# Patient Record
Sex: Female | Born: 1970 | Race: White | Hispanic: No | Marital: Married | Smoking: Current every day smoker
Health system: Southern US, Community
[De-identification: ages and names within clinical notes are randomized; demographics above are authoritative.]

## PROBLEM LIST (undated history)

## (undated) DIAGNOSIS — E119 Type 2 diabetes mellitus without complications: Secondary | ICD-10-CM

## (undated) DIAGNOSIS — I341 Nonrheumatic mitral (valve) prolapse: Secondary | ICD-10-CM

## (undated) DIAGNOSIS — E669 Obesity, unspecified: Secondary | ICD-10-CM

## (undated) DIAGNOSIS — I10 Essential (primary) hypertension: Secondary | ICD-10-CM

## (undated) DIAGNOSIS — I471 Supraventricular tachycardia, unspecified: Secondary | ICD-10-CM

## (undated) HISTORY — PX: NASAL SEPTUM SURGERY: SHX37

## (undated) HISTORY — PX: LUMBAR FUSION: SHX111

## (undated) HISTORY — PX: TONSILLECTOMY: SUR1361

## (undated) HISTORY — DX: Type 2 diabetes mellitus without complications: E11.9

---
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
## 1999-11-05 HISTORY — PX: TUBAL LIGATION: SHX77

## 2015-02-17 ENCOUNTER — Other Ambulatory Visit (HOSPITAL_COMMUNITY): Payer: Self-pay | Admitting: Physician Assistant

## 2015-02-17 ENCOUNTER — Other Ambulatory Visit (HOSPITAL_COMMUNITY): Payer: Self-pay | Admitting: Physical Medicine and Rehabilitation

## 2015-02-17 DIAGNOSIS — Z981 Arthrodesis status: Secondary | ICD-10-CM

## 2015-02-23 ENCOUNTER — Ambulatory Visit (HOSPITAL_COMMUNITY): Payer: Self-pay

## 2015-02-24 ENCOUNTER — Emergency Department (HOSPITAL_COMMUNITY): Payer: No Typology Code available for payment source

## 2015-02-24 ENCOUNTER — Encounter (HOSPITAL_COMMUNITY): Payer: Self-pay | Admitting: Emergency Medicine

## 2015-02-24 ENCOUNTER — Emergency Department (HOSPITAL_COMMUNITY)
Admission: EM | Admit: 2015-02-24 | Discharge: 2015-02-24 | Disposition: A | Discharge status: Home / Self Care | Payer: No Typology Code available for payment source | Attending: Emergency Medicine | Admitting: Emergency Medicine

## 2015-02-24 DIAGNOSIS — Y92410 Unspecified street and highway as the place of occurrence of the external cause: Secondary | ICD-10-CM | POA: Diagnosis not present

## 2015-02-24 DIAGNOSIS — S161XXA Strain of muscle, fascia and tendon at neck level, initial encounter: Secondary | ICD-10-CM | POA: Diagnosis not present

## 2015-02-24 DIAGNOSIS — Z9889 Other specified postprocedural states: Secondary | ICD-10-CM | POA: Diagnosis not present

## 2015-02-24 DIAGNOSIS — I10 Essential (primary) hypertension: Secondary | ICD-10-CM | POA: Insufficient documentation

## 2015-02-24 DIAGNOSIS — S79911A Unspecified injury of right hip, initial encounter: Secondary | ICD-10-CM | POA: Insufficient documentation

## 2015-02-24 DIAGNOSIS — S3992XA Unspecified injury of lower back, initial encounter: Secondary | ICD-10-CM | POA: Insufficient documentation

## 2015-02-24 DIAGNOSIS — M5441 Lumbago with sciatica, right side: Secondary | ICD-10-CM

## 2015-02-24 DIAGNOSIS — Z720 Tobacco use: Secondary | ICD-10-CM | POA: Diagnosis not present

## 2015-02-24 DIAGNOSIS — M5442 Lumbago with sciatica, left side: Secondary | ICD-10-CM

## 2015-02-24 DIAGNOSIS — R200 Anesthesia of skin: Secondary | ICD-10-CM | POA: Diagnosis not present

## 2015-02-24 DIAGNOSIS — Y9389 Activity, other specified: Secondary | ICD-10-CM | POA: Diagnosis not present

## 2015-02-24 DIAGNOSIS — Y998 Other external cause status: Secondary | ICD-10-CM | POA: Diagnosis not present

## 2015-02-24 HISTORY — DX: Essential (primary) hypertension: I10

## 2015-02-24 MED ORDER — OXYCODONE-ACETAMINOPHEN 5-325 MG PO TABS
1.0000 | ORAL_TABLET | Freq: Once | ORAL | Status: AC
  Administered 2015-02-24: 1 via ORAL
  Filled 2015-02-24: qty 1

## 2015-02-24 MED ORDER — HYDROMORPHONE HCL 1 MG/ML IJ SOLN
1.0000 mg | Freq: Once | INTRAMUSCULAR | Status: AC
  Administered 2015-02-24: 1 mg via INTRAMUSCULAR
  Filled 2015-02-24: qty 1

## 2015-02-24 MED ORDER — OXYCODONE-ACETAMINOPHEN 5-325 MG PO TABS: 1.0000 | ORAL_TABLET | ORAL | Status: DC | PRN

## 2015-02-24 MED ORDER — ONDANSETRON 8 MG PO TBDP
8.0000 mg | ORAL_TABLET | Freq: Once | ORAL | Status: AC
  Administered 2015-02-24: 8 mg via ORAL
  Filled 2015-02-24: qty 1

## 2015-02-24 MED ORDER — METHOCARBAMOL 500 MG PO TABS: 500.0000 mg | ORAL_TABLET | Freq: Three times a day (TID) | ORAL | Status: DC
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
## 2015-09-27 ENCOUNTER — Emergency Department
Admission: EM | Admit: 2015-09-27 | Discharge: 2015-09-27 | Disposition: A | Discharge status: Home / Self Care | Payer: Medicaid Other | Attending: Emergency Medicine | Admitting: Emergency Medicine

## 2015-09-27 ENCOUNTER — Emergency Department: Payer: Medicaid Other

## 2015-09-27 ENCOUNTER — Encounter: Payer: Self-pay | Admitting: Emergency Medicine

## 2015-09-27 DIAGNOSIS — F17200 Nicotine dependence, unspecified, uncomplicated: Secondary | ICD-10-CM | POA: Diagnosis not present

## 2015-09-27 DIAGNOSIS — R51 Headache: Secondary | ICD-10-CM | POA: Insufficient documentation

## 2015-09-27 DIAGNOSIS — R519 Headache, unspecified: Secondary | ICD-10-CM

## 2015-09-27 DIAGNOSIS — I10 Essential (primary) hypertension: Secondary | ICD-10-CM | POA: Diagnosis not present

## 2015-09-27 DIAGNOSIS — H538 Other visual disturbances: Secondary | ICD-10-CM | POA: Insufficient documentation

## 2015-09-27 LAB — CBC WITH DIFFERENTIAL/PLATELET
BASOS PCT: 0 %
Basophils Absolute: 0.1 10*3/uL (ref 0–0.1)
EOS ABS: 0.3 10*3/uL (ref 0–0.7)
Eosinophils Relative: 2 %
HCT: 45.3 % (ref 35.0–47.0)
Hemoglobin: 15.4 g/dL (ref 12.0–16.0)
Lymphocytes Relative: 26 %
Lymphs Abs: 4.3 10*3/uL — ABNORMAL HIGH (ref 1.0–3.6)
MCH: 31.4 pg (ref 26.0–34.0)
MCHC: 33.9 g/dL (ref 32.0–36.0)
MCV: 92.5 fL (ref 80.0–100.0)
MONO ABS: 0.9 10*3/uL (ref 0.2–0.9)
Monocytes Relative: 6 %
Neutro Abs: 10.8 10*3/uL — ABNORMAL HIGH (ref 1.4–6.5)
Neutrophils Relative %: 66 %
PLATELETS: 298 10*3/uL (ref 150–440)
RBC: 4.9 MIL/uL (ref 3.80–5.20)
RDW: 13 % (ref 11.5–14.5)
WBC: 16.3 10*3/uL — AB (ref 3.6–11.0)

## 2015-09-27 LAB — COMPREHENSIVE METABOLIC PANEL
ALT: 26 U/L (ref 14–54)
AST: 23 U/L (ref 15–41)
Albumin: 4 g/dL (ref 3.5–5.0)
Alkaline Phosphatase: 61 U/L (ref 38–126)
Anion gap: 8 (ref 5–15)
BUN: 9 mg/dL (ref 6–20)
CHLORIDE: 103 mmol/L (ref 101–111)
CO2: 25 mmol/L (ref 22–32)
Calcium: 9.3 mg/dL (ref 8.9–10.3)
Creatinine, Ser: 0.74 mg/dL (ref 0.44–1.00)
GFR calc Af Amer: >60 mL/min (ref >60)
GFR calc non Af Amer: >60 mL/min (ref >60)
Glucose, Bld: 148 mg/dL — ABNORMAL HIGH (ref 65–99)
POTASSIUM: 3.8 mmol/L (ref 3.5–5.1)
Sodium: 136 mmol/L (ref 135–145)
Total Bilirubin: 0.2 mg/dL — ABNORMAL LOW (ref 0.3–1.2)
Total Protein: 7.5 g/dL (ref 6.5–8.1)

## 2015-09-27 MED ORDER — HYDROMORPHONE HCL 1 MG/ML IJ SOLN
0.5000 mg | Freq: Once | INTRAMUSCULAR | Status: AC
  Administered 2015-09-27: 0.5 mg via INTRAVENOUS

## 2015-09-27 MED ORDER — BUTALBITAL-APAP-CAFFEINE 50-325-40 MG PO TABS: 1.0000 | ORAL_TABLET | Freq: Four times a day (QID) | ORAL | Status: AC | PRN

## 2015-09-27 MED ORDER — HYDROMORPHONE HCL 1 MG/ML IJ SOLN
1.0000 mg | Freq: Once | INTRAMUSCULAR | Status: AC
  Administered 2015-09-27: 1 mg via INTRAVENOUS

## 2015-09-27 MED ORDER — METOCLOPRAMIDE HCL 5 MG/ML IJ SOLN
10.0000 mg | Freq: Once | INTRAMUSCULAR | Status: AC
  Administered 2015-09-27: 10 mg via INTRAVENOUS
  Filled 2015-09-27: qty 2

## 2015-09-27 MED ORDER — HYDROMORPHONE HCL 1 MG/ML IJ SOLN
INTRAMUSCULAR | Status: AC
  Filled 2015-09-27: qty 1

## 2015-09-27 MED ORDER — DIPHENHYDRAMINE HCL 50 MG/ML IJ SOLN
INTRAMUSCULAR | Status: AC
  Administered 2015-09-27: 25 mg via INTRAVENOUS
  Filled 2015-09-27: qty 1

## 2015-09-27 MED ORDER — KETOROLAC TROMETHAMINE 30 MG/ML IJ SOLN
30.0000 mg | Freq: Once | INTRAMUSCULAR | Status: AC
  Administered 2015-09-27: 30 mg via INTRAVENOUS

## 2015-09-27 MED ORDER — METOCLOPRAMIDE HCL 5 MG/ML IJ SOLN
INTRAMUSCULAR | Status: AC
  Administered 2015-09-27: 10 mg via INTRAVENOUS
  Filled 2015-09-27: qty 2

## 2015-09-27 MED ORDER — DIPHENHYDRAMINE HCL 50 MG/ML IJ SOLN
25.0000 mg | Freq: Once | INTRAMUSCULAR | Status: AC
  Administered 2015-09-27: 25 mg via INTRAVENOUS

## 2015-09-27 MED ORDER — HYDROMORPHONE HCL 1 MG/ML IJ SOLN
INTRAMUSCULAR | Status: AC
  Administered 2015-09-27: 0.5 mg via INTRAVENOUS
  Filled 2015-09-27: qty 1

## 2015-09-27 MED ORDER — SODIUM CHLORIDE 0.9 % IV SOLN
Freq: Once | INTRAVENOUS | Status: AC
  Administered 2015-09-27: 20:00:00 via INTRAVENOUS

## 2015-09-27 MED ORDER — KETOROLAC TROMETHAMINE 30 MG/ML IJ SOLN
INTRAMUSCULAR | Status: AC
  Administered 2015-09-27: 30 mg via INTRAVENOUS
  Filled 2015-09-27: qty 1

## 2015-09-27 NOTE — ED Provider Notes (Signed)
[facility] Emergency Department Provider Note     Time seen: ----------------------------------------- 7:46 PM on 09/27/2015 -----------------------------------------    I have reviewed the triage vital signs and the nursing notes.   HISTORY  Chief Complaint Headache    HPI Leslie Zamora is a 45 y.o. female who presents ER for multiple headaches today. Patient states her last to 3 weeks she's had headaches located in the left temporal area. States she's had multiple episodes of sharp left-sided headache. Patient states when the headache gets her vision was blurry in both eyes. Patient denies any other complaints. She denies any numbness tingling or weakness associated with headache.   Past Medical History  Diagnosis Date  . Hypertension     There are no active problems to display for this patient.   Past Surgical History  Procedure Laterality Date  . Tonsillectomy    . Nasal septum surgery    . Lumbar fusion    . Tubal ligation      Allergies Other and Gabapentin  Social History Social History  Substance Use Topics  . Smoking status: Current Every Day Smoker -- 1.00 packs/day  . Smokeless tobacco: None  . Alcohol Use: No    Review of Systems Constitutional: Negative for fever. Eyes: Negative for visual changes. ENT: Negative for sore throat. Cardiovascular: Negative for chest pain. Respiratory: Negative for shortness of breath. Gastrointestinal: Negative for abdominal pain, vomiting and diarrhea. Genitourinary: Negative for dysuria. Musculoskeletal: Negative for back pain. Skin: Negative for rash. Neurological: Positive for headache  10-point ROS otherwise negative.  ____________________________________________   PHYSICAL EXAM:  VITAL SIGNS: ED Triage Vitals  Enc Vitals Group     BP 09/27/15 1927 157/86 mmHg     Pulse Rate 09/27/15 1927 90     Resp 09/27/15 1927 18     Temp 09/27/15 1927 98 F (36.7 C)     Temp  Source 09/27/15 1927 Oral     SpO2 09/27/15 1927 97 %     Weight 09/27/15 1927 227 lb 1.2 oz (103 kg)     Height 09/27/15 1927 5\' 5"  (1.651 m)     Head Cir --      Peak Flow --      Pain Score 09/27/15 1929 8     Pain Loc --      Pain Edu? --      Excl. in [place]? --     Constitutional: Alert and oriented. Mild distress Eyes: Conjunctivae are normal. PERRL. Normal extraocular movements. ENT   Head: Normocephalic and atraumatic.   Nose: No congestion/rhinnorhea.   Mouth/Throat: Mucous membranes are moist.   Neck: No stridor. Cardiovascular: Normal rate, regular rhythm. Normal and symmetric distal pulses are present in all extremities. No murmurs, rubs, or gallops. Respiratory: Normal respiratory effort without tachypnea nor retractions. Breath sounds are clear and equal bilaterally. No wheezes/rales/rhonchi. Gastrointestinal: Soft and nontender. No distention. No abdominal bruits.  Musculoskeletal: Nontender with normal range of motion in all extremities. No joint effusions.  No lower extremity tenderness nor edema. Neurologic:  Normal speech and language. No gross focal neurologic deficits are appreciated. Speech is normal. No gait instability. Skin:  Skin is warm, dry and intact. No rash noted. Psychiatric: Mood and affect are normal. Speech and behavior are normal. Patient exhibits appropriate insight and judgment. ____________________________________________  ED COURSE:  Pertinent labs & imaging results that were available during my care of the patient were reviewed by me and considered in my medical decision making (see  chart for details). Patient is in no acute distress, will give IV headache cocktail and obtained CT imaging. ____________________________________________    LABS (pertinent positives/negatives)  Labs Reviewed  CBC WITH DIFFERENTIAL/PLATELET - Abnormal; Notable for the following:    WBC 16.3 (*)    Neutro Abs 10.8 (*)    Lymphs Abs 4.3 (*)    All  other components within normal limits  COMPREHENSIVE METABOLIC PANEL - Abnormal; Notable for the following:    Glucose, Bld 148 (*)    Total Bilirubin 0.2 (*)    All other components within normal limits    RADIOLOGY Images were viewed by me  CT head FINDINGS: The ventricles are normal in size and configuration. There is a small cavum septum pellucidum, an anatomic variant. There is no intracranial mass hemorrhage, extra-axial fluid collection, or midline shift. The gray-white compartments are normal. No acute infarct evident. The bony calvarium appears intact. The mastoid air cells are clear. Visualized orbits appear symmetric and normal bilaterally.  IMPRESSION: Study within normal limits. ____________________________________________  FINAL ASSESSMENT AND PLAN  Headache  Plan: Patient with labs and imaging as dictated above. Unclear etiology for her headaches. Patient is chronically on 10 mg Vicodin and sent home. She is stable for outpatient follow-up with her primary care doctor.   Earleen Newport, MD   Earleen Newport, MD 09/27/15 [phone]

## 2015-09-27 NOTE — ED Notes (Signed)
NAD.  VSS.

## 2015-09-27 NOTE — Discharge Instructions (Signed)

## 2015-09-27 NOTE — ED Notes (Signed)
Pt ambulatory to triage with c/o of "burst" HA, lasting approx 30 sec over the last 2-3 weeks located in left temporal and right occipital.  Pt reports diag of HTN but stopped taking meds and started excerising and that resolved it until pt hurt her back and BP has increased. Here tonight because family member worried about increasing intensity and frequency of HA.  Pt having HA in triage lasting 10-15 sec.

## 2015-10-11 ENCOUNTER — Other Ambulatory Visit: Payer: Self-pay | Admitting: Orthopedic Surgery

## 2015-10-11 DIAGNOSIS — M545 Low back pain: Secondary | ICD-10-CM

## 2015-10-20 ENCOUNTER — Other Ambulatory Visit: Payer: Self-pay

## 2015-11-29 ENCOUNTER — Encounter: Payer: Self-pay | Admitting: Emergency Medicine

## 2015-11-29 ENCOUNTER — Emergency Department
Admission: EM | Admit: 2015-11-29 | Discharge: 2015-11-29 | Disposition: A | Discharge status: Home / Self Care | Payer: Medicaid Other | Attending: Emergency Medicine | Admitting: Emergency Medicine

## 2015-11-29 ENCOUNTER — Emergency Department: Payer: Medicaid Other

## 2015-11-29 DIAGNOSIS — Z792 Long term (current) use of antibiotics: Secondary | ICD-10-CM | POA: Insufficient documentation

## 2015-11-29 DIAGNOSIS — I10 Essential (primary) hypertension: Secondary | ICD-10-CM | POA: Diagnosis not present

## 2015-11-29 DIAGNOSIS — L03211 Cellulitis of face: Secondary | ICD-10-CM | POA: Insufficient documentation

## 2015-11-29 DIAGNOSIS — Z79899 Other long term (current) drug therapy: Secondary | ICD-10-CM | POA: Diagnosis not present

## 2015-11-29 DIAGNOSIS — F17200 Nicotine dependence, unspecified, uncomplicated: Secondary | ICD-10-CM | POA: Insufficient documentation

## 2015-11-29 DIAGNOSIS — Z791 Long term (current) use of non-steroidal anti-inflammatories (NSAID): Secondary | ICD-10-CM | POA: Insufficient documentation

## 2015-11-29 DIAGNOSIS — R220 Localized swelling, mass and lump, head: Secondary | ICD-10-CM | POA: Diagnosis present

## 2015-11-29 LAB — CBC WITH DIFFERENTIAL/PLATELET
BASOS ABS: 0.1 10*3/uL (ref 0–0.1)
Basophils Relative: 1 %
EOS ABS: 0.3 10*3/uL (ref 0–0.7)
EOS PCT: 2 %
HCT: 42.7 % (ref 35.0–47.0)
Hemoglobin: 14.4 g/dL (ref 12.0–16.0)
Lymphocytes Relative: 21 %
Lymphs Abs: 3.6 10*3/uL (ref 1.0–3.6)
MCH: 30.4 pg (ref 26.0–34.0)
MCHC: 33.6 g/dL (ref 32.0–36.0)
MCV: 90.3 fL (ref 80.0–100.0)
Monocytes Absolute: 1 10*3/uL — ABNORMAL HIGH (ref 0.2–0.9)
Monocytes Relative: 6 %
Neutro Abs: 11.9 10*3/uL — ABNORMAL HIGH (ref 1.4–6.5)
Neutrophils Relative %: 70 %
PLATELETS: 298 10*3/uL (ref 150–440)
RBC: 4.73 MIL/uL (ref 3.80–5.20)
RDW: 12.8 % (ref 11.5–14.5)
WBC: 17 10*3/uL — AB (ref 3.6–11.0)

## 2015-11-29 LAB — BASIC METABOLIC PANEL
Anion gap: 12 (ref 5–15)
BUN: 11 mg/dL (ref 6–20)
CALCIUM: 9.6 mg/dL (ref 8.9–10.3)
CO2: 26 mmol/L (ref 22–32)
CREATININE: 0.78 mg/dL (ref 0.44–1.00)
Chloride: 102 mmol/L (ref 101–111)
GFR calc Af Amer: >60 mL/min (ref >60)
Glucose, Bld: 109 mg/dL — ABNORMAL HIGH (ref 65–99)
Potassium: 3.6 mmol/L (ref 3.5–5.1)
SODIUM: 140 mmol/L (ref 135–145)

## 2015-11-29 MED ORDER — CLINDAMYCIN HCL 150 MG PO CAPS: 150.0000 mg | ORAL_CAPSULE | Freq: Four times a day (QID) | ORAL | Status: DC

## 2015-11-29 MED ORDER — TRAMADOL HCL 50 MG PO TABS: 50.0000 mg | ORAL_TABLET | Freq: Four times a day (QID) | ORAL | Status: DC | PRN

## 2015-11-29 MED ORDER — SULFAMETHOXAZOLE-TRIMETHOPRIM 800-160 MG PO TABS: 1.0000 | ORAL_TABLET | Freq: Two times a day (BID) | ORAL | Status: DC

## 2015-11-29 MED ORDER — IBUPROFEN 800 MG PO TABS
800.0000 mg | ORAL_TABLET | Freq: Three times a day (TID) | ORAL | Status: DC | PRN
Start: 2015-11-29 — End: 2017-08-04

## 2015-11-29 MED ORDER — CLINDAMYCIN PHOSPHATE 600 MG/50ML IV SOLN
600.0000 mg | Freq: Once | INTRAVENOUS | Status: AC
  Administered 2015-11-29: 600 mg via INTRAVENOUS
  Filled 2015-11-29: qty 50

## 2015-11-29 MED ORDER — IOHEXOL 300 MG/ML  SOLN
75.0000 mL | Freq: Once | INTRAMUSCULAR | Status: AC | PRN
  Administered 2015-11-29: 75 mL via INTRAVENOUS
  Filled 2015-11-29: qty 75

## 2015-11-29 MED ORDER — DIPHENHYDRAMINE HCL 50 MG/ML IJ SOLN
25.0000 mg | Freq: Once | INTRAMUSCULAR | Status: AC
  Administered 2015-11-29: 25 mg via INTRAVENOUS
  Filled 2015-11-29: qty 1

## 2015-11-29 NOTE — ED Notes (Addendum)
Pt to ed with c/o right eye swelling and pain x 2 days,  Seen at urgent care and sent here.  Pt states increased pain with blinking or closing of the eye.  Pt reports blurred vision in right eye.

## 2015-11-29 NOTE — Discharge Instructions (Signed)
Take medication as directed.

## 2015-11-29 NOTE — ED Provider Notes (Signed)
[facility] Emergency Department Provider Note  ____________________________________________  Time seen: Approximately 2:18 PM  I have reviewed the triage vital signs and the nursing notes.   HISTORY  Chief Complaint Facial Swelling    HPI Leslie Zamora is a 45 y.o. female patient's report right eye edema and pain for 2 days. Patient was sent from urgent care clinic to the ED. Patient stated the pain increased with blinking or closing her eyes. Patient also reported blurry vision right eye. Patient denies any provocative incident for this complaint. No palliative measures taken for this complaint. She does not complain of pain at this time. Patient is also complaining of right sided headache. Patient denies any dental problems at this time.   Past Medical History  Diagnosis Date  . Hypertension     There are no active problems to display for this patient.   Past Surgical History  Procedure Laterality Date  . Tonsillectomy    . Nasal septum surgery    . Lumbar fusion    . Tubal ligation      Current Outpatient Rx  Name  Route  Sig  Dispense  Refill  . BIOTIN PO   Oral   Take 1 capsule by mouth daily.         . butalbital-acetaminophen-caffeine (FIORICET) 50-325-40 MG tablet   Oral   Take 1-2 tablets by mouth every 6 (six) hours as needed for headache.   20 tablet   0   . cetirizine (ZYRTEC) 10 MG tablet   Oral   Take 10 mg by mouth daily.         . clindamycin (CLEOCIN) 150 MG capsule   Oral   Take 1 capsule (150 mg total) by mouth 4 (four) times daily.   40 capsule   0   . gabapentin (NEURONTIN) 400 MG capsule   Oral   Take 800 mg by mouth 3 (three) times daily. MAY TAKE TWO ADDITIONAL IF NEEDED         . HYDROcodone-acetaminophen (NORCO/VICODIN) 5-325 MG per tablet   Oral   Take 1 tablet by mouth 2 (two) times daily as needed.         Marland Kitchen ibuprofen (ADVIL,MOTRIN) 800 MG tablet   Oral   Take 1 tablet (800 mg total) by  mouth every 8 (eight) hours as needed for moderate pain.   15 tablet   0   . meloxicam (MOBIC) 15 MG tablet   Oral   Take 15 mg by mouth at bedtime.         . methocarbamol (ROBAXIN) 500 MG tablet   Oral   Take 1 tablet (500 mg total) by mouth 3 (three) times daily.   21 tablet   0   . Multiple Vitamin (MULTIVITAMIN WITH MINERALS) TABS tablet   Oral   Take 1 tablet by mouth daily.         Marland Kitchen oxyCODONE-acetaminophen (PERCOCET/ROXICET) 5-325 MG per tablet   Oral   Take 1 tablet by mouth every 4 (four) hours as needed.   12 tablet   0   . traMADol (ULTRAM) 50 MG tablet   Oral   Take 1 tablet (50 mg total) by mouth every 6 (six) hours as needed for moderate pain.   12 tablet   0     Allergies Other and Gabapentin  Family History  Problem Relation Age of Onset  . Adopted: Yes    Social History Social History  Substance Use Topics  .  Smoking status: Current Every Day Smoker -- 1.00 packs/day  . Smokeless tobacco: None  . Alcohol Use: Yes    Review of Systems Constitutional: No fever/chills Eyes: No visual changes. ENT: No sore throat. Cardiovascular: Denies chest pain. Respiratory: Denies shortness of breath. Gastrointestinal: No abdominal pain.  No nausea, no vomiting.  No diarrhea.  No constipation. Genitourinary: Negative for dysuria. Musculoskeletal: Negative for back pain. Skin: Negative for rash. Neurological: Negative for headaches, focal weakness or numbness. 10-point ROS otherwise negative.  ____________________________________________   PHYSICAL EXAM:  VITAL SIGNS: ED Triage Vitals  Enc Vitals Group     BP 11/29/15 1334 152/92 mmHg     Pulse Rate 11/29/15 1334 91     Resp 11/29/15 1334 18     Temp 11/29/15 1334 98.6 F (37 C)     Temp Source 11/29/15 1334 Oral     SpO2 11/29/15 1334 98 %     Weight 11/29/15 1334 215 lb (97.523 kg)     Height 11/29/15 1334 5\' 4"  (1.626 m)     Head Cir --      Peak Flow --      Pain Score 11/29/15  1334 0     Pain Loc --      Pain Edu? --      Excl. in [place]? --     Constitutional: Alert and oriented. Well appearing and in no acute distress. Eyes: Conjunctivae are normal. PERRL. EOMI. Head: Atraumatic. Nose: No congestion/rhinnorhea. Mouth/Throat: Mucous membranes are moist.  Oropharynx non-erythematous. Neck: No stridor. No cervical spine tenderness to palpation. Hematological/Lymphatic/Immunilogical: No cervical lymphadenopathy. Cardiovascular: Normal rate, regular rhythm. Grossly normal heart sounds.  Good peripheral circulation. Respiratory: Normal respiratory effort.  No retractions. Lungs CTAB. Gastrointestinal: Soft and nontender. No distention. No abdominal bruits. No CVA tenderness. Musculoskeletal: No lower extremity tenderness nor edema.  No joint effusions. Neurologic:  Normal speech and language. No gross focal neurologic deficits are appreciated. No gait instability. Skin:  Skin is warm, dry and intact. No rash noted. Mild erythema and edema right maxillary area to the inferior right orbital area Psychiatric: Mood and affect are normal. Speech and behavior are normal.  ____________________________________________   LABS (all labs ordered are listed, but only abnormal results are displayed)  Labs Reviewed  BASIC METABOLIC PANEL - Abnormal; Notable for the following:    Glucose, Bld 109 (*)    All other components within normal limits  CBC WITH DIFFERENTIAL/PLATELET - Abnormal; Notable for the following:    WBC 17.0 (*)    Neutro Abs 11.9 (*)    Monocytes Absolute 1.0 (*)    All other components within normal limits   ____________________________________________  EKG   ____________________________________________  RADIOLOGY No facial abscess. ____________________________________________   PROCEDURES  Procedure(s) performed: None  Critical Care performed: No  ____________________________________________   INITIAL IMPRESSION / ASSESSMENT AND PLAN  / ED COURSE  Pertinent labs & imaging results that were available during my care of the patient were reviewed by me and considered in my medical decision making (see chart for details).  Right maxillary cellulitis. Discuss CT findings with patient. Give 600 mg of clindamycin and will follow-up with oral medication for 10 days. ____________________________________________   FINAL CLINICAL IMPRESSION(S) / ED DIAGNOSES  Final diagnoses:  Facial cellulitis      Sable Feil, PA-C 11/29/15 1538  Nance Pear, MD 12/05/15 1400

## 2015-11-29 NOTE — ED Provider Notes (Signed)
Addendum patient began having some itching around her eyes prior to discharge. Benadryl was given to the patient and prescription was changed from clindamycin to Bactrim DS twice a day for 10 days. Patient is continue taking Benadryl as needed for itching and return to the emergency room if any severe worsening of her symptoms.  Johnn Hai, PA-C 11/29/15 1729  Nance Pear, MD 11/29/15 [id number]

## 2015-11-29 NOTE — ED Notes (Signed)
Patient transported to CT 

## 2015-12-06 ENCOUNTER — Other Ambulatory Visit: Payer: Self-pay | Admitting: Unknown Physician Specialty

## 2015-12-06 DIAGNOSIS — R1032 Left lower quadrant pain: Secondary | ICD-10-CM

## 2015-12-07 ENCOUNTER — Ambulatory Visit
Admission: RE | Admit: 2015-12-07 | Discharge: 2015-12-07 | Disposition: A | Discharge status: Home / Self Care | Payer: Medicare Other | Source: Ambulatory Visit | Attending: Unknown Physician Specialty | Admitting: Unknown Physician Specialty

## 2015-12-07 ENCOUNTER — Ambulatory Visit: Admission: RE | Admit: 2015-12-07 | Payer: Medicare Other | Source: Ambulatory Visit

## 2015-12-07 DIAGNOSIS — R1032 Left lower quadrant pain: Secondary | ICD-10-CM | POA: Diagnosis not present

## 2016-01-18 ENCOUNTER — Emergency Department: Payer: Medicare Other

## 2016-01-18 ENCOUNTER — Encounter: Payer: Self-pay | Admitting: Emergency Medicine

## 2016-01-18 DIAGNOSIS — F17210 Nicotine dependence, cigarettes, uncomplicated: Secondary | ICD-10-CM | POA: Diagnosis not present

## 2016-01-18 DIAGNOSIS — I10 Essential (primary) hypertension: Secondary | ICD-10-CM | POA: Insufficient documentation

## 2016-01-18 DIAGNOSIS — I341 Nonrheumatic mitral (valve) prolapse: Secondary | ICD-10-CM | POA: Diagnosis not present

## 2016-01-18 DIAGNOSIS — Z791 Long term (current) use of non-steroidal anti-inflammatories (NSAID): Secondary | ICD-10-CM | POA: Diagnosis not present

## 2016-01-18 DIAGNOSIS — Z79899 Other long term (current) drug therapy: Secondary | ICD-10-CM | POA: Insufficient documentation

## 2016-01-18 DIAGNOSIS — R51 Headache: Secondary | ICD-10-CM | POA: Diagnosis present

## 2016-01-18 LAB — BASIC METABOLIC PANEL
ANION GAP: 8 (ref 5–15)
BUN: 12 mg/dL (ref 6–20)
CALCIUM: 9.2 mg/dL (ref 8.9–10.3)
CHLORIDE: 107 mmol/L (ref 101–111)
CO2: 25 mmol/L (ref 22–32)
Creatinine, Ser: 0.77 mg/dL (ref 0.44–1.00)
GFR calc Af Amer: >60 mL/min (ref >60)
GFR calc non Af Amer: >60 mL/min (ref >60)
GLUCOSE: 106 mg/dL — AB (ref 65–99)
POTASSIUM: 3.5 mmol/L (ref 3.5–5.1)
Sodium: 140 mmol/L (ref 135–145)

## 2016-01-18 LAB — CBC
HEMATOCRIT: 42 % (ref 35.0–47.0)
HEMOGLOBIN: 14.4 g/dL (ref 12.0–16.0)
MCH: 31.2 pg (ref 26.0–34.0)
MCHC: 34.3 g/dL (ref 32.0–36.0)
MCV: 90.9 fL (ref 80.0–100.0)
Platelets: 331 10*3/uL (ref 150–440)
RBC: 4.62 MIL/uL (ref 3.80–5.20)
RDW: 13 % (ref 11.5–14.5)
WBC: 15.8 10*3/uL — ABNORMAL HIGH (ref 3.6–11.0)

## 2016-01-18 LAB — TROPONIN I: Troponin I: <0.03 ng/mL (ref <0.031)

## 2016-01-18 NOTE — ED Notes (Signed)
States has been having "a blood pressure issue for a while".  Patient describes blood pressure as labile.  Also c/o SOB and headaches x 2-3 weeks.  Symptoms worse over past few days.

## 2016-01-19 ENCOUNTER — Emergency Department: Payer: Medicare Other

## 2016-01-19 ENCOUNTER — Emergency Department
Admission: EM | Admit: 2016-01-19 | Discharge: 2016-01-19 | Disposition: A | Discharge status: Home / Self Care | Payer: Medicare Other | Attending: Emergency Medicine | Admitting: Emergency Medicine

## 2016-01-19 DIAGNOSIS — I10 Essential (primary) hypertension: Secondary | ICD-10-CM

## 2016-01-19 HISTORY — DX: Nonrheumatic mitral (valve) prolapse: I34.1

## 2016-01-19 LAB — URINALYSIS COMPLETE WITH MICROSCOPIC (ARMC ONLY)
BILIRUBIN URINE: NEGATIVE
Glucose, UA: NEGATIVE mg/dL
HGB URINE DIPSTICK: NEGATIVE
KETONES UR: NEGATIVE mg/dL
NITRITE: NEGATIVE
PH: 6 (ref 5.0–8.0)
Protein, ur: NEGATIVE mg/dL
SPECIFIC GRAVITY, URINE: 1.013 (ref 1.005–1.030)

## 2016-01-19 MED ORDER — SODIUM CHLORIDE 0.9 % IV SOLN: Freq: Once | INTRAVENOUS | Status: DC

## 2016-01-19 MED ORDER — PROCHLORPERAZINE EDISYLATE 5 MG/ML IJ SOLN
10.0000 mg | Freq: Four times a day (QID) | INTRAMUSCULAR | Status: DC | PRN
  Filled 2016-01-19: qty 2

## 2016-01-19 NOTE — Discharge Instructions (Signed)
DASH Eating Plan °DASH stands for "Dietary Approaches to Stop Hypertension." The DASH eating plan is a healthy eating plan that has been shown to reduce high blood pressure (hypertension). Additional health benefits may include reducing the risk of type 2 diabetes mellitus, heart disease, and stroke. The DASH eating plan may also help with weight loss. °WHAT DO I NEED TO KNOW ABOUT THE DASH EATING PLAN? °For the DASH eating plan, you will follow these general guidelines: °· Choose foods with a percent daily value for sodium of less than 5% (as listed on the food label). °· Use salt-free seasonings or herbs instead of table salt or sea salt. °· Check with your health care provider or pharmacist before using salt substitutes. °· Eat lower-sodium products, often labeled as "lower sodium" or "no salt added." °· Eat fresh foods. °· Eat more vegetables, fruits, and low-fat dairy products. °· Choose whole grains. Look for the word "whole" as the first word in the ingredient list. °· Choose fish and skinless chicken or turkey more often than red meat. Limit fish, poultry, and meat to 6 oz (170 g) each day. °· Limit sweets, desserts, sugars, and sugary drinks. °· Choose heart-healthy fats. °· Limit cheese to 1 oz (28 g) per day. °· Eat more home-cooked food and less restaurant, buffet, and fast food. °· Limit fried foods. °· Cook foods using methods other than frying. °· Limit canned vegetables. If you do use them, rinse them well to decrease the sodium. °· When eating at a restaurant, ask that your food be prepared with less salt, or no salt if possible. °WHAT FOODS CAN I EAT? °Seek help from a dietitian for individual calorie needs. °Grains °Whole grain or whole wheat bread. Brown rice. Whole grain or whole wheat pasta. Quinoa, bulgur, and whole grain cereals. Low-sodium cereals. Corn or whole wheat flour tortillas. Whole grain cornbread. Whole grain crackers. Low-sodium crackers. °Vegetables °Fresh or frozen vegetables  (raw, steamed, roasted, or grilled). Low-sodium or reduced-sodium tomato and vegetable juices. Low-sodium or reduced-sodium tomato sauce and paste. Low-sodium or reduced-sodium canned vegetables.  °Fruits °All fresh, canned (in natural juice), or frozen fruits. °Meat and Other Protein Products °Ground beef (85% or leaner), grass-fed beef, or beef trimmed of fat. Skinless chicken or turkey. Ground chicken or turkey. Pork trimmed of fat. All fish and seafood. Eggs. Dried beans, peas, or lentils. Unsalted nuts and seeds. Unsalted canned beans. °Dairy °Low-fat dairy products, such as skim or 1% milk, 2% or reduced-fat cheeses, low-fat ricotta or cottage cheese, or plain low-fat yogurt. Low-sodium or reduced-sodium cheeses. °Fats and Oils °Tub margarines without trans fats. Light or reduced-fat mayonnaise and salad dressings (reduced sodium). Avocado. Safflower, olive, or canola oils. Natural peanut or almond butter. °Other °Unsalted popcorn and pretzels. °The items listed above may not be a complete list of recommended foods or beverages. Contact your dietitian for more options. °WHAT FOODS ARE NOT RECOMMENDED? °Grains °White bread. White pasta. White rice. Refined cornbread. Bagels and croissants. Crackers that contain trans fat. °Vegetables °Creamed or fried vegetables. Vegetables in a cheese sauce. Regular canned vegetables. Regular canned tomato sauce and paste. Regular tomato and vegetable juices. °Fruits °Dried fruits. Canned fruit in light or heavy syrup. Fruit juice. °Meat and Other Protein Products °Fatty cuts of meat. Ribs, chicken wings, bacon, sausage, bologna, salami, chitterlings, fatback, hot dogs, bratwurst, and packaged luncheon meats. Salted nuts and seeds. Canned beans with salt. °Dairy °Whole or 2% milk, cream, half-and-half, and cream cheese. Whole-fat or sweetened yogurt. Full-fat   cheeses or blue cheese. Nondairy creamers and whipped toppings. Processed cheese, cheese spreads, or cheese  curds. °Condiments °Onion and garlic salt, seasoned salt, table salt, and sea salt. Canned and packaged gravies. Worcestershire sauce. Tartar sauce. Barbecue sauce. Teriyaki sauce. Soy sauce, including reduced sodium. Steak sauce. Fish sauce. Oyster sauce. Cocktail sauce. Horseradish. Ketchup and mustard. Meat flavorings and tenderizers. Bouillon cubes. Hot sauce. Tabasco sauce. Marinades. Taco seasonings. Relishes. °Fats and Oils °Butter, stick margarine, lard, shortening, ghee, and bacon fat. Coconut, palm kernel, or palm oils. Regular salad dressings. °Other °Pickles and olives. Salted popcorn and pretzels. °The items listed above may not be a complete list of foods and beverages to avoid. Contact your dietitian for more information. °WHERE CAN I FIND MORE INFORMATION? °National Heart, Lung, [employer] Institute: www.nhlbi.nih.gov/health/health-topics/topics/dash/ °  °This information is not intended to replace advice given to you by your health care provider. Make sure you discuss any questions you have with your health care provider. °  °Document Released: 10/10/2011 Document Revised: 11/11/2014 Document Reviewed: 08/25/2013 °Elsevier Interactive Patient Education ©2016 [employer] ° °Hypertension °Hypertension, commonly called high blood pressure, is when the force of blood pumping through your arteries is too strong. Your arteries are the blood vessels that carry blood from your heart throughout your body. A blood pressure reading consists of a higher number over a lower number, such as 110/72. The higher number (systolic) is the pressure inside your arteries when your heart pumps. The lower number (diastolic) is the pressure inside your arteries when your heart relaxes. Ideally you want your blood pressure below 120/80. °Hypertension forces your heart to work harder to pump blood. Your arteries may become narrow or stiff. Having untreated or uncontrolled hypertension can cause heart attack, stroke, kidney  disease, and other problems. °RISK FACTORS °Some risk factors for high blood pressure are controllable. Others are not.  °Risk factors you cannot control include:  °· Race. You may be at higher risk if you are African American. °· Age. Risk increases with age. °· Gender. Men are at higher risk than women before age 45 years. After age 65, women are at higher risk than men. °Risk factors you can control include: °· Not getting enough exercise or physical activity. °· Being overweight. °· Getting too much fat, sugar, calories, or salt in your diet. °· Drinking too much alcohol. °SIGNS AND SYMPTOMS °Hypertension does not usually cause signs or symptoms. Extremely high blood pressure (hypertensive crisis) may cause headache, anxiety, shortness of breath, and nosebleed. °DIAGNOSIS °To check if you have hypertension, your health care provider will measure your blood pressure while you are seated, with your arm held at the level of your heart. It should be measured at least twice using the same arm. Certain conditions can cause a difference in blood pressure between your right and left arms. A blood pressure reading that is higher than normal on one occasion does not mean that you need treatment. If it is not clear whether you have high blood pressure, you may be asked to return on a different day to have your blood pressure checked again. Or, you may be asked to monitor your blood pressure at home for 1 or more weeks. °TREATMENT °Treating high blood pressure includes making lifestyle changes and possibly taking medicine. Living a healthy lifestyle can help lower high blood pressure. You may need to change some of your habits. °Lifestyle changes may include: °· Following the DASH diet. This diet is high in fruits, vegetables, and whole   grains. It is low in salt, red meat, and added sugars.  Keep your sodium intake below 2,300 mg per day.  Getting at least 30-45 minutes of aerobic exercise at least 4 times per  week.  Losing weight if necessary.  Not smoking.  Limiting alcoholic beverages.  Learning ways to reduce stress. Your health care provider may prescribe medicine if lifestyle changes are not enough to get your blood pressure under control, and if one of the following is true:  You are 70-54 years of age and your systolic blood pressure is above 140.  You are 45 years of age or older, and your systolic blood pressure is above 150.  Your diastolic blood pressure is above 90.  You have diabetes, and your systolic blood pressure is over [id number] or your diastolic blood pressure is over 90.  You have kidney disease and your blood pressure is above 140/90.  You have heart disease and your blood pressure is above 140/90. Your personal target blood pressure may vary depending on your medical conditions, your age, and other factors. HOME CARE INSTRUCTIONS  Have your blood pressure rechecked as directed by your health care provider.   Take medicines only as directed by your health care provider. Follow the directions carefully. Blood pressure medicines must be taken as prescribed. The medicine does not work as well when you skip doses. Skipping doses also puts you at risk for problems.  Do not smoke.   Monitor your blood pressure at home as directed by your health care provider. SEEK MEDICAL CARE IF:   You think you are having a reaction to medicines taken.  You have recurrent headaches or feel dizzy.  You have swelling in your ankles.  You have trouble with your vision. SEEK IMMEDIATE MEDICAL CARE IF:  You develop a severe headache or confusion.  You have unusual weakness, numbness, or feel faint.  You have severe chest or abdominal pain.  You vomit repeatedly.  You have trouble breathing. MAKE SURE YOU:   Understand these instructions.  Will watch your condition.  Will get help right away if you are not doing well or get worse.   This information is not intended to  replace advice given to you by your health care provider. Make sure you discuss any questions you have with your health care provider.   Document Released: 10/21/2005 Document Revised: 03/07/2015 Document Reviewed: 08/13/2013 Elsevier Interactive Patient Education 2016 [place] going with his diet and exercise. Try the hydrochlorothiazide one half pill a day. Make sure to follow up with your family practice doctor as planned. A daily record of your blood pressures. Please return for any further problems.

## 2016-01-19 NOTE — ED Notes (Signed)
Pt ambulatory to toilet with steady gait noted. No distress noted at this time.

## 2016-01-19 NOTE — ED Notes (Addendum)
Per husband, pt has uncontrolled BP for a long time. Pt laying in stretcher in dark with eyes shut. Husband states headaches for past few weeks, sensitivity to light, dizziness, SOB. Pt states headache on L posterior side, radiating across forehead. Pt does not take anything for BP.

## 2016-01-19 NOTE — ED Provider Notes (Signed)
[facility] Emergency Department Provider Note  ____________________________________________  Time seen: Approximately 2:59 AM  I have reviewed the triage vital signs and the nursing notes.   HISTORY  Chief Complaint Headache and Hypertension   HPI Leslie Zamora is a 45 y.o. female patient reports she has had hypertension when she was younger. Her blood pressure was elevated was taking medicines that she controlled with walking and listening to music and diet. Lately her blood pressures been going up again. She has constant headache behind her eyes. She says light makes it worse. She also has a headache and left ear. He has occasional episodes of lightheadedness and dizziness. Blood pressure goes up to a high of 180 and occasionally will go down to normal. Headache is moderately severe at present. It does not come on suddenly. Currently light makes it worse but this is not always true. Patient says sometimes she gets short of breath when her blood pressures elevated.   Past Medical History  Diagnosis Date  . Hypertension   . Mitral valve prolapse     There are no active problems to display for this patient.   Past Surgical History  Procedure Laterality Date  . Tonsillectomy    . Nasal septum surgery    . Lumbar fusion    . Tubal ligation      Current Outpatient Rx  Name  Route  Sig  Dispense  Refill  . BIOTIN PO   Oral   Take 1 capsule by mouth daily.         . butalbital-acetaminophen-caffeine (FIORICET) 50-325-40 MG tablet   Oral   Take 1-2 tablets by mouth every 6 (six) hours as needed for headache.   20 tablet   0   . cetirizine (ZYRTEC) 10 MG tablet   Oral   Take 10 mg by mouth daily.         Marland Kitchen gabapentin (NEURONTIN) 400 MG capsule   Oral   Take 800 mg by mouth 3 (three) times daily. MAY TAKE TWO ADDITIONAL IF NEEDED         . HYDROcodone-acetaminophen (NORCO/VICODIN) 5-325 MG per tablet   Oral   Take 1 tablet by mouth 2  (two) times daily as needed.         Marland Kitchen ibuprofen (ADVIL,MOTRIN) 800 MG tablet   Oral   Take 1 tablet (800 mg total) by mouth every 8 (eight) hours as needed for moderate pain.   15 tablet   0   . meloxicam (MOBIC) 15 MG tablet   Oral   Take 15 mg by mouth at bedtime.         . methocarbamol (ROBAXIN) 500 MG tablet   Oral   Take 1 tablet (500 mg total) by mouth 3 (three) times daily.   21 tablet   0   . Multiple Vitamin (MULTIVITAMIN WITH MINERALS) TABS tablet   Oral   Take 1 tablet by mouth daily.         Marland Kitchen oxyCODONE-acetaminophen (PERCOCET/ROXICET) 5-325 MG per tablet   Oral   Take 1 tablet by mouth every 4 (four) hours as needed.   12 tablet   0   . sulfamethoxazole-trimethoprim (BACTRIM DS,SEPTRA DS) 800-160 MG tablet   Oral   Take 1 tablet by mouth 2 (two) times daily.   20 tablet   0   . traMADol (ULTRAM) 50 MG tablet   Oral   Take 1 tablet (50 mg total) by mouth every 6 (six) hours  as needed for moderate pain.   12 tablet   0     Allergies Other; Clindamycin/lincomycin; and Gabapentin  Family History  Problem Relation Age of Onset  . Adopted: Yes    Social History Social History  Substance Use Topics  . Smoking status: Current Every Day Smoker -- 1.00 packs/day  . Smokeless tobacco: None  . Alcohol Use: Yes    Review of Systems Constitutional: No fever/chills Eyes: No visual changes. ENT: No sore throat. Cardiovascular: Denies chest pain. Respiratory: Denies shortness of breath. Gastrointestinal: No abdominal pain.  No nausea, no vomiting.  No diarrhea.  No constipation. Genitourinary: Negative for dysuria. Musculoskeletal: Negative for back pain. Skin: Negative for rash. Neurological: Negative for headaches, focal weakness or numbness.  10-point ROS otherwise negative.  ____________________________________________   PHYSICAL EXAM:  VITAL SIGNS: ED Triage Vitals  Enc Vitals Group     BP 01/18/16 2013 164/83 mmHg     Pulse  Rate 01/18/16 2013 95     Resp 01/18/16 2013 18     Temp 01/18/16 2013 97.7 F (36.5 C)     Temp src --      SpO2 01/18/16 2013 99 %     Weight 01/18/16 2013 215 lb (97.523 kg)     Height 01/18/16 2013 5\' 5"  (1.651 m)     Head Cir --      Peak Flow --      Pain Score 01/18/16 2013 7     Pain Loc --      Pain Edu? --      Excl. in [place]? --     Constitutional: Alert and oriented. Well appearing and in no acute distress. Eyes: Conjunctivae are normal. PERRL. EOMI. Head: Atraumatic. Nose: No congestion/rhinnorhea. Mouth/Throat: Mucous membranes are moist.  Oropharynx non-erythematous. Neck: No stridor.   Cardiovascular: Normal rate, regular rhythm. Grossly normal heart sounds.  Good peripheral circulation. Respiratory: Normal respiratory effort.  No retractions. Lungs CTAB. Gastrointestinal: Soft and nontender. No distention. No abdominal bruits. No CVA tenderness. Musculoskeletal: No lower extremity tenderness nor edema.  No joint effusions. Neurologic:  Normal speech and language. No gross focal neurologic deficits are appreciated. No gait instability. Skin:  Skin is warm, dry and intact. No rash noted. Psychiatric: Mood and affect are normal. Speech and behavior are normal.  ____________________________________________   LABS (all labs ordered are listed, but only abnormal results are displayed)  Labs Reviewed  BASIC METABOLIC PANEL - Abnormal; Notable for the following:    Glucose, Bld 106 (*)    All other components within normal limits  CBC - Abnormal; Notable for the following:    WBC 15.8 (*)    All other components within normal limits  URINALYSIS COMPLETEWITH MICROSCOPIC ([facility] ONLY) - Abnormal; Notable for the following:    Color, Urine YELLOW (*)    APPearance CLEAR (*)    Leukocytes, UA TRACE (*)    Bacteria, UA RARE (*)    Squamous Epithelial / LPF 0-5 (*)    All other components within normal limits  TROPONIN I    ____________________________________________  EKG  EKG read and shorted by me shows normal sinus rhythm rate of 90 normal axis essentially normal EKG ____________________________________________  RADIOLOGY  Chest x-ray read by radiology as essentially normal CT of the head read by radiology is essentially normal explanation for the headache ____________________________________________   PROCEDURES    ____________________________________________   INITIAL IMPRESSION / ASSESSMENT AND PLAN / ED COURSE  Pertinent labs & imaging  results that were available during my care of the patient were reviewed by me and considered in my medical decision making (see chart for details).   ____________________________________________   FINAL CLINICAL IMPRESSION(S) / ED DIAGNOSES  Final diagnoses:  Essential hypertension      Nena Polio, MD 01/19/16 [phone]

## 2016-01-19 NOTE — ED Notes (Signed)
Pt transported to CT via stretcher.  

## 2016-03-21 ENCOUNTER — Other Ambulatory Visit: Payer: Self-pay | Admitting: Orthopaedic Surgery

## 2016-03-21 DIAGNOSIS — M4326 Fusion of spine, lumbar region: Secondary | ICD-10-CM

## 2016-03-29 ENCOUNTER — Ambulatory Visit
Admission: RE | Admit: 2016-03-29 | Discharge: 2016-03-29 | Disposition: A | Discharge status: Home / Self Care | Payer: Medicare Other | Source: Ambulatory Visit | Attending: Orthopaedic Surgery | Admitting: Orthopaedic Surgery

## 2016-03-29 DIAGNOSIS — M4326 Fusion of spine, lumbar region: Secondary | ICD-10-CM

## 2016-03-29 MED ORDER — GADOBENATE DIMEGLUMINE 529 MG/ML IV SOLN
20.0000 mL | Freq: Once | INTRAVENOUS | Status: AC | PRN
  Administered 2016-03-29: 20 mL via INTRAVENOUS

## 2016-05-26 ENCOUNTER — Encounter: Payer: Self-pay | Admitting: Emergency Medicine

## 2016-05-26 ENCOUNTER — Emergency Department: Payer: Medicare Other

## 2016-05-26 ENCOUNTER — Emergency Department
Admission: EM | Admit: 2016-05-26 | Discharge: 2016-05-26 | Disposition: A | Discharge status: Home / Self Care | Payer: Medicare Other | Attending: Emergency Medicine | Admitting: Emergency Medicine

## 2016-05-26 DIAGNOSIS — R1032 Left lower quadrant pain: Secondary | ICD-10-CM | POA: Diagnosis present

## 2016-05-26 DIAGNOSIS — Z792 Long term (current) use of antibiotics: Secondary | ICD-10-CM | POA: Diagnosis not present

## 2016-05-26 DIAGNOSIS — N83202 Unspecified ovarian cyst, left side: Secondary | ICD-10-CM

## 2016-05-26 DIAGNOSIS — F17200 Nicotine dependence, unspecified, uncomplicated: Secondary | ICD-10-CM | POA: Diagnosis not present

## 2016-05-26 DIAGNOSIS — I10 Essential (primary) hypertension: Secondary | ICD-10-CM | POA: Insufficient documentation

## 2016-05-26 DIAGNOSIS — N838 Other noninflammatory disorders of ovary, fallopian tube and broad ligament: Secondary | ICD-10-CM

## 2016-05-26 DIAGNOSIS — R52 Pain, unspecified: Secondary | ICD-10-CM

## 2016-05-26 DIAGNOSIS — Z79891 Long term (current) use of opiate analgesic: Secondary | ICD-10-CM | POA: Diagnosis not present

## 2016-05-26 DIAGNOSIS — Z791 Long term (current) use of non-steroidal anti-inflammatories (NSAID): Secondary | ICD-10-CM | POA: Diagnosis not present

## 2016-05-26 DIAGNOSIS — Z79899 Other long term (current) drug therapy: Secondary | ICD-10-CM | POA: Insufficient documentation

## 2016-05-26 LAB — URINALYSIS COMPLETE WITH MICROSCOPIC (ARMC ONLY)
Bilirubin Urine: NEGATIVE
Glucose, UA: NEGATIVE mg/dL
Ketones, ur: NEGATIVE mg/dL
Leukocytes, UA: NEGATIVE
Nitrite: NEGATIVE
PH: 7 (ref 5.0–8.0)
PROTEIN: 30 mg/dL — AB
SPECIFIC GRAVITY, URINE: 1.002 — AB (ref 1.005–1.030)

## 2016-05-26 LAB — COMPREHENSIVE METABOLIC PANEL
ALBUMIN: 3.9 g/dL (ref 3.5–5.0)
ALK PHOS: 69 U/L (ref 38–126)
ALT: 22 U/L (ref 14–54)
AST: 14 U/L — ABNORMAL LOW (ref 15–41)
Anion gap: 7 (ref 5–15)
BILIRUBIN TOTAL: 0.5 mg/dL (ref 0.3–1.2)
BUN: 17 mg/dL (ref 6–20)
CALCIUM: 9.8 mg/dL (ref 8.9–10.3)
CHLORIDE: 100 mmol/L — AB (ref 101–111)
CO2: 30 mmol/L (ref 22–32)
Creatinine, Ser: 0.72 mg/dL (ref 0.44–1.00)
Glucose, Bld: 103 mg/dL — ABNORMAL HIGH (ref 65–99)
Potassium: 4.4 mmol/L (ref 3.5–5.1)
Sodium: 137 mmol/L (ref 135–145)
Total Protein: 7.9 g/dL (ref 6.5–8.1)

## 2016-05-26 LAB — CBC
HCT: 41.6 % (ref 35.0–47.0)
HEMOGLOBIN: 14.5 g/dL (ref 12.0–16.0)
MCH: 31.7 pg (ref 26.0–34.0)
MCHC: 34.9 g/dL (ref 32.0–36.0)
MCV: 90.8 fL (ref 80.0–100.0)
Platelets: 313 10*3/uL (ref 150–440)
RBC: 4.58 MIL/uL (ref 3.80–5.20)
RDW: 13.1 % (ref 11.5–14.5)
WBC: 14.4 10*3/uL — ABNORMAL HIGH (ref 3.6–11.0)

## 2016-05-26 LAB — LIPASE, BLOOD: Lipase: 31 U/L (ref 11–51)

## 2016-05-26 LAB — PREGNANCY, URINE: Preg Test, Ur: NEGATIVE

## 2016-05-26 MED ORDER — OXYCODONE-ACETAMINOPHEN 5-325 MG PO TABS
ORAL_TABLET | ORAL | Status: AC
  Administered 2016-05-26: 1 via ORAL
  Filled 2016-05-26: qty 1

## 2016-05-26 MED ORDER — ONDANSETRON 4 MG PO TBDP
4.0000 mg | ORAL_TABLET | Freq: Once | ORAL | Status: AC
  Administered 2016-05-26: 4 mg via ORAL
  Filled 2016-05-26: qty 1

## 2016-05-26 MED ORDER — OXYCODONE-ACETAMINOPHEN 5-325 MG PO TABS
1.0000 | ORAL_TABLET | Freq: Once | ORAL | Status: AC
  Administered 2016-05-26: 1 via ORAL

## 2016-05-26 MED ORDER — OXYCODONE-ACETAMINOPHEN 5-325 MG PO TABS: 1.0000 | ORAL_TABLET | Freq: Four times a day (QID) | ORAL | 0 refills | Status: AC | PRN

## 2016-05-26 MED ORDER — OXYCODONE-ACETAMINOPHEN 5-325 MG PO TABS
1.0000 | ORAL_TABLET | Freq: Once | ORAL | Status: AC
  Administered 2016-05-26: 1 via ORAL
  Filled 2016-05-26: qty 1

## 2016-05-26 MED ORDER — ONDANSETRON HCL 4 MG PO TABS: 4.0000 mg | ORAL_TABLET | Freq: Three times a day (TID) | ORAL | 0 refills | Status: AC | PRN

## 2016-05-26 NOTE — ED Triage Notes (Signed)
Pt presents with lower abd pain yesterday and today with some nausea. Pt is currently on her menstrual cycle.

## 2016-05-26 NOTE — ED Provider Notes (Signed)
[facility] Emergency Department Provider Note  ____________________________________________  Time seen: Approximately 10:53 AM  I have reviewed the triage vital signs and the nursing notes.   HISTORY  Chief Complaint Abdominal Pain and Nausea    HPI Leslie Zamora is a 45 y.o. female , NAD, presents to emergency department with sudden onset left lower quadrant abdominal pain yesterday with accompanying nausea. Patient states pain began suddenly in the left lower quadrant and caused nausea yesterday. States pain has begun to move in the lower abdomen and she has noted increased frequency of urine. Has not noted any decreased output of urine. Has had no vaginal discharge, pelvic pain, hematuria. Notes that she is currently on her period as well as has had tubal ligation. Notes she has only been sexually active with her husband who has had a vasectomy. Denies any diarrhea or constipation or hematochezia. Has not noted any blood in the emesis. No fevers, chills, body aches. No injuries or traumas to the abdomen.   Past Medical History:  Diagnosis Date  . Hypertension   . Mitral valve prolapse     There are no active problems to display for this patient.   Past Surgical History:  Procedure Laterality Date  . LUMBAR FUSION    . NASAL SEPTUM SURGERY    . TONSILLECTOMY    . TUBAL LIGATION      Current Outpatient Rx  . Order #: [id number] Class: Historical Med  . Order #: [id number] Class: Print  . Order #: [id number] Class: Historical Med  . Order #: [id number] Class: Historical Med  . Order #: [id number] Class: Historical Med  . Order #: [id number] Class: Print  . Order #: [id number] Class: Historical Med  . Order #: [id number] Class: Print  . Order #: [id number] Class: Historical Med  . Order #: [id number] Class: Print  . Order #: [id number] Class: Print  . Order #: [id number] Class: Print  . Order #: [id number] Class: Print  . Order #: [id number] Class: Print     Allergies Other; Clindamycin/lincomycin; and Gabapentin  Family History  Problem Relation Age of Onset  . Adopted: Yes    Social History Social History  Substance Use Topics  . Smoking status: Current Every Day Smoker    Packs/day: 1.00  . Smokeless tobacco: Never Used  . Alcohol use Yes     Review of Systems  Constitutional: No fever/chills, fatigue Eyes: No visual changes. Cardiovascular: No chest pain. Respiratory: No cough. No shortness of breath. No wheezing.  Gastrointestinal: Positive abdominal pain.  Positive nausea but no vomiting.  No diarrhea, hematochezia, constipation. Genitourinary: Positive increased urinary frequency. Negative for dysuria, hematuria. No urinary hesitancy, urgency. Musculoskeletal: Negative for back pain.  Skin: Negative for rash. Neurological: Negative for headaches, focal weakness or numbness. No saddle paresthesias, loss of bowel or bladder control. 10-point ROS otherwise negative.  ____________________________________________   PHYSICAL EXAM:  VITAL SIGNS: ED Triage Vitals  Enc Vitals Group     BP 05/26/16 1022 (!) 166/88     Pulse Rate 05/26/16 1022 69     Resp 05/26/16 1022 20     Temp 05/26/16 1022 97.9 F (36.6 C)     Temp src --      SpO2 05/26/16 1022 100 %     Weight 05/26/16 1022 215 lb (97.5 kg)     Height 05/26/16 1022 5\' 4"  (1.626 m)     Head Circumference --      Peak Flow --      Pain Score 05/26/16 1023 5  Pain Loc --      Pain Edu? --      Excl. in [place]? --      Constitutional: Alert and oriented. Well appearing and in no acute distress but in pain. Eyes: Conjunctivae are normal. PERRL. EOMI without pain.  Head: Atraumatic. Neck: Supple with full range of motion Hematological/Lymphatic/Immunilogical: No cervical lymphadenopathy. Cardiovascular: Normal rate, regular rhythm. Normal S1 and S2.  Good peripheral circulation with 2+ pulses noted in bilateral lower extremities. Respiratory: Normal  respiratory effort without tachypnea or retractions. Lungs CTAB with breath sounds noted in all lung fields. No wheeze, rhonchi, rales. Gastrointestinal: Significant tenderness to palpation about the suprapubic and left lower quadrant area but is soft without distention. All other quadrants are soft and nontender without distention or guarding. No abdominal rigidity or rebound tenderness is noted. No CVA tenderness. Musculoskeletal: No lower extremity tenderness nor edema.  No joint effusions. Neurologic:  Normal speech and language. No gross focal neurologic deficits are appreciated.  Skin:  Skin is warm, dry and intact. No rash noted. Psychiatric: Mood and affect are normal. Speech and behavior are normal. Patient exhibits appropriate insight and judgement.   ____________________________________________   LABS (all labs ordered are listed, but only abnormal results are displayed)  Labs Reviewed  COMPREHENSIVE METABOLIC PANEL - Abnormal; Notable for the following:       Result Value   Chloride 100 (*)    Glucose, Bld 103 (*)    AST 14 (*)    All other components within normal limits  CBC - Abnormal; Notable for the following:    WBC 14.4 (*)    All other components within normal limits  URINALYSIS COMPLETEWITH MICROSCOPIC ([facility] ONLY) - Abnormal; Notable for the following:    Color, Urine AMBER (*)    APPearance CLEAR (*)    Specific Gravity, Urine 1.002 (*)    Hgb urine dipstick 3+ (*)    Protein, ur 30 (*)    Bacteria, UA RARE (*)    Squamous Epithelial / LPF 0-5 (*)    All other components within normal limits  LIPASE, BLOOD  PREGNANCY, URINE   ____________________________________________  EKG  None ____________________________________________  RADIOLOGY I have personally viewed and evaluated these images (plain radiographs) as part of my medical decision making, as well as reviewing the written report by the radiologist.  US Transvaginal Non-ob  Result Date:  05/26/2016 CLINICAL DATA:  Left ovarian cyst on CT EXAM: TRANSABDOMINAL AND TRANSVAGINAL ULTRASOUND OF PELVIS TECHNIQUE: Both transabdominal and transvaginal ultrasound examinations of the pelvis were performed. Transabdominal technique was performed for global imaging of the pelvis including uterus, ovaries, adnexal regions, and pelvic cul-de-sac. It was necessary to proceed with endovaginal exam following the transabdominal exam to visualize the uterus and bilateral ovaries. COMPARISON:  CT abdomen pelvis dated 05/26/2016 FINDINGS: Uterus Measurements: 8.5 x 4.8 x 6.7 cm. 2.2 x 3.3 x 2.1 cm intramural fibroid in the left uterine fundus. Endometrium Thickness: 7 mm.  No focal abnormality visualized. Right ovary Measurements: 2.5 x 1.6 x 2.3 cm. Normal appearance/no adnexal mass. Left ovary Measurements: 5.0 x 2.3 x 3.4 cm. Two simple cysts, measuring 3.9 x 2.0 x 2.7 cm and 3.1 x 1.7 x 2.3 cm. Other findings No abnormal free fluid. IMPRESSION: Two simple left ovarian cysts, measuring up to 3.9 cm, likely physiologic. 3.3 cm intramural fibroid in the left uterine fundus. Electronically Signed   By: Julian Hy M.D.   On: 05/26/2016 14:33  US Pelvis Complete  Result  Date: 05/26/2016 CLINICAL DATA:  Left ovarian cyst on CT EXAM: TRANSABDOMINAL AND TRANSVAGINAL ULTRASOUND OF PELVIS TECHNIQUE: Both transabdominal and transvaginal ultrasound examinations of the pelvis were performed. Transabdominal technique was performed for global imaging of the pelvis including uterus, ovaries, adnexal regions, and pelvic cul-de-sac. It was necessary to proceed with endovaginal exam following the transabdominal exam to visualize the uterus and bilateral ovaries. COMPARISON:  CT abdomen pelvis dated 05/26/2016 FINDINGS: Uterus Measurements: 8.5 x 4.8 x 6.7 cm. 2.2 x 3.3 x 2.1 cm intramural fibroid in the left uterine fundus. Endometrium Thickness: 7 mm.  No focal abnormality visualized. Right ovary Measurements: 2.5 x 1.6 x  2.3 cm. Normal appearance/no adnexal mass. Left ovary Measurements: 5.0 x 2.3 x 3.4 cm. Two simple cysts, measuring 3.9 x 2.0 x 2.7 cm and 3.1 x 1.7 x 2.3 cm. Other findings No abnormal free fluid. IMPRESSION: Two simple left ovarian cysts, measuring up to 3.9 cm, likely physiologic. 3.3 cm intramural fibroid in the left uterine fundus. Electronically Signed   By: Julian Hy M.D.   On: 05/26/2016 14:33  Ct Renal Stone Study  Result Date: 05/26/2016 CLINICAL DATA:  Increasing abdominal pain, vomiting x1 since yesterday. Low-grade fever. Left lower quadrant pain. EXAM: CT ABDOMEN AND PELVIS WITHOUT CONTRAST TECHNIQUE: Multidetector CT imaging of the abdomen and pelvis was performed following the standard protocol without IV contrast. COMPARISON:  None. FINDINGS: Lower chest:  No acute findings. Hepatobiliary: Liver and gallbladder appear normal. Pancreas: No mass or inflammatory process identified on this un-enhanced exam. Spleen: Within normal limits in size. Adrenals/Urinary Tract: Adrenal glands appear normal. Kidneys appear normal without mass, stone or hydronephrosis. No ureteral or bladder calculi identified. Bladder appears normal. Stomach/Bowel: Bowel is normal in caliber. Scattered diverticulosis within the sigmoid colon without evidence of acute diverticulitis. No bowel wall thickening or evidence of bowel wall inflammation. Appendix appears normal. Stomach appears normal. Vascular/Lymphatic: Abdominal aorta is normal in caliber. No enlarged lymph nodes seen within the abdomen or pelvis. Reproductive: Tubal ligation clips in place. Low-density mass within the left adnexa measures 3.5 x 3.1 cm, most likely benign ovarian cyst. No free fluid or obvious inflammatory change in the adjacent left adnexa. Right adnexal region is unremarkable. Other: No free fluid or abscess collections seen. No free intraperitoneal air. Musculoskeletal: Posterior fixation hardware old at the lumbosacral junction,  appearing appropriately positioned. No acute or suspicious osseous finding. Superficial soft tissues are unremarkable. IMPRESSION: 1. Left adnexal mass, measuring 3.5 x 3.1 cm, most likely benign ovarian cyst. No free fluid or obvious inflammatory change within the adjacent left adnexa. Given the history of left lower quadrant pain, would consider pelvic ultrasound for confirmation. 2. Sigmoid colon diverticulosis without evidence of acute diverticulitis. 3. Remainder of the abdomen and pelvis CT is unremarkable. No bowel obstruction. No renal or ureteral calculi. No free fluid. Electronically Signed   By: Franki Cabot M.D.   On: 05/26/2016 11:50   ____________________________________________    PROCEDURES  Procedure(s) performed: None   Medications  oxyCODONE-acetaminophen (PERCOCET/ROXICET) 5-325 MG per tablet 1 tablet (1 tablet Oral Given 05/26/16 1104)  ondansetron (ZOFRAN-ODT) disintegrating tablet 4 mg (4 mg Oral Given 05/26/16 1104)  oxyCODONE-acetaminophen (PERCOCET/ROXICET) 5-325 MG per tablet 1 tablet (1 tablet Oral Given 05/26/16 1441)    Patient has tolerated oral Roxicet well without any side effects. ____________________________________________   INITIAL IMPRESSION / ASSESSMENT AND PLAN / ED COURSE  Pertinent labs & imaging results that were available during my care of the patient were  reviewed by me and considered in my medical decision making (see chart for details).  Patient's diagnosis is consistent with left ovarian cyst. Patient will be discharged home with prescriptions for Zofran and Roxicet to take as needed and as directed. Patient is to follow up with Dr. Kenton Kingfisher in OB/GYN for recheck if symptoms persist past this treatment course. Patient is given ED precautions to return to the ED for any worsening or new symptoms.      ____________________________________________  FINAL CLINICAL IMPRESSION(S) / ED DIAGNOSES  Final diagnoses:  Left ovarian cyst       NEW MEDICATIONS STARTED DURING THIS VISIT:  Discharge Medication List as of 05/26/2016  2:48 PM    START taking these medications   Details  ondansetron (ZOFRAN) 4 MG tablet Take 1 tablet (4 mg total) by mouth every 8 (eight) hours as needed for nausea or vomiting., Starting Sun 05/26/2016, Until Sun 06/02/2016, Print    !! oxyCODONE-acetaminophen (ROXICET) 5-325 MG tablet Take 1 tablet by mouth every 6 (six) hours as needed., Starting Sun 05/26/2016, Until Mon 05/26/2017, Print     !! - Potential duplicate medications found. Please discuss with provider.           Braxton Feathers, PA-C 05/26/16 [place], MD 05/26/16 1556

## 2016-05-26 NOTE — ED Notes (Signed)

## 2016-05-26 NOTE — ED Notes (Signed)
Returned from CT.

## 2016-05-26 NOTE — ED Notes (Signed)
Increasing abdominal pain since yesterday. Pt states it feels like contractions. Vomited x1 yesterday but c/o nausea.  Feels worse after eating but is able to eat.  Low grade fever yesterday.  C/o LLQ pain.  Denies pain with urination but feels pressure in pelvis.  Pt states she had steroid injections in her spine on Wednesday. States she does have a period now and has discharge that resembles the end of a period.  States she has had a tubal ligation and husband had a vastectomy.

## 2016-10-30 IMAGING — US US TRANSVAGINAL NON-OB
1 series · 14 of 25 positions shown · non-contrast
Comparison: CT abdomen pelvis dated 05/26/2016

CLINICAL DATA: Left ovarian cyst on CT

EXAM:
TRANSABDOMINAL AND TRANSVAGINAL ULTRASOUND OF PELVIS
TECHNIQUE: Both transabdominal and transvaginal ultrasound examinations of the
pelvis were performed. Transabdominal technique was performed for
global imaging of the pelvis including uterus, ovaries, adnexal
regions, and pelvic cul-de-sac. It was necessary to proceed with
endovaginal exam following the transabdominal exam to visualize the
uterus and bilateral ovaries.

[Series 1: us transvaginal non-ob · 0.25mm/px · 14 of 99 slices shown]
[im 1/99]
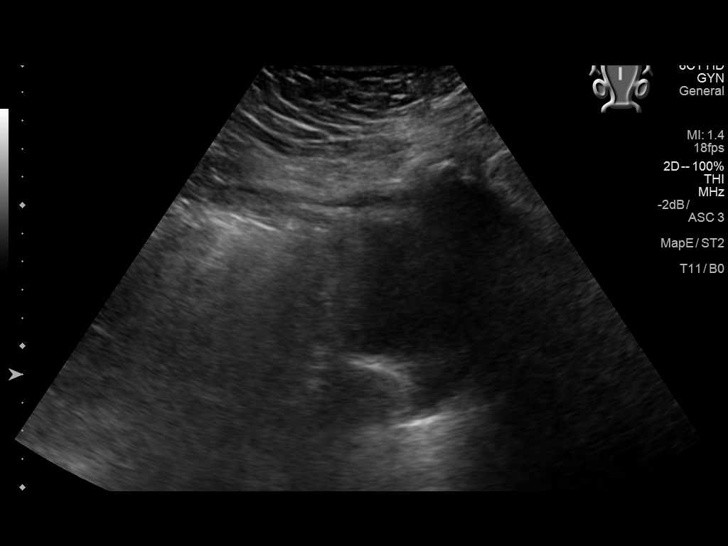
[im 9/99]
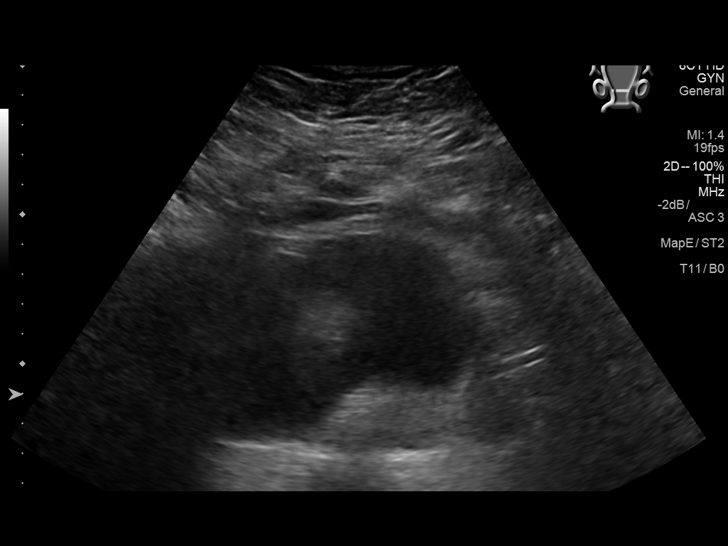
[im 17/99]
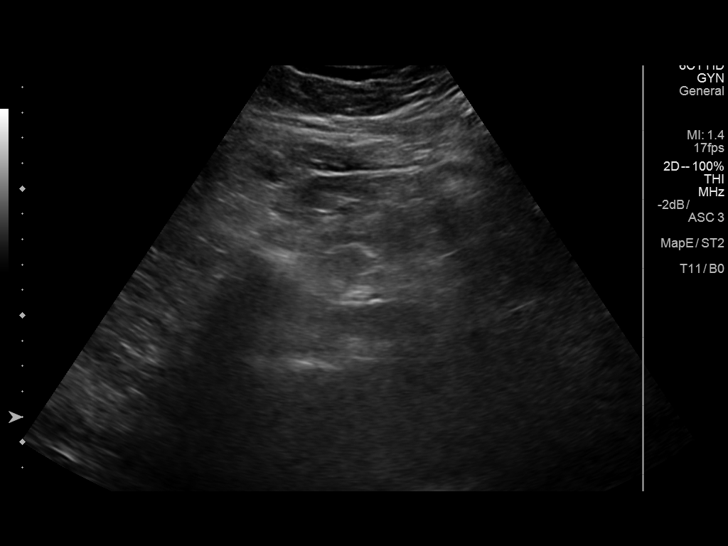
[im 25/99]
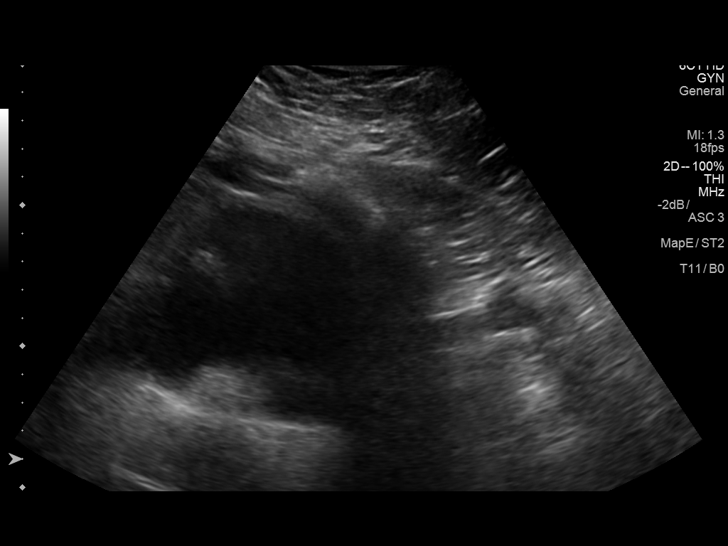
[im 33/99]
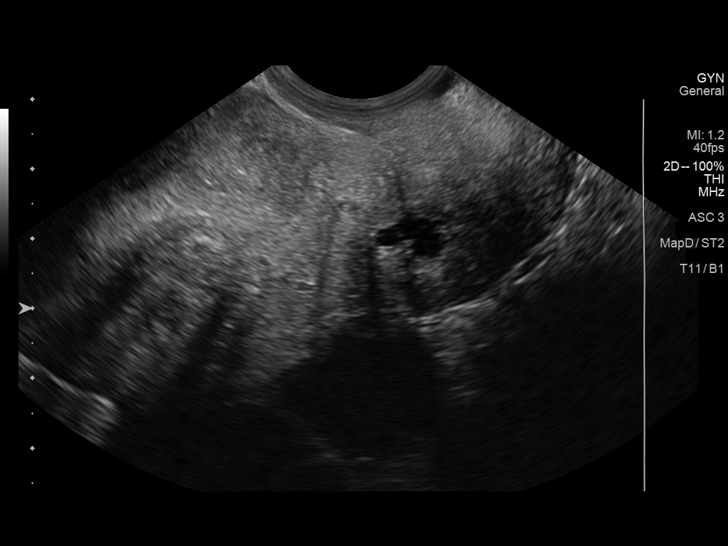
[im 37/99]
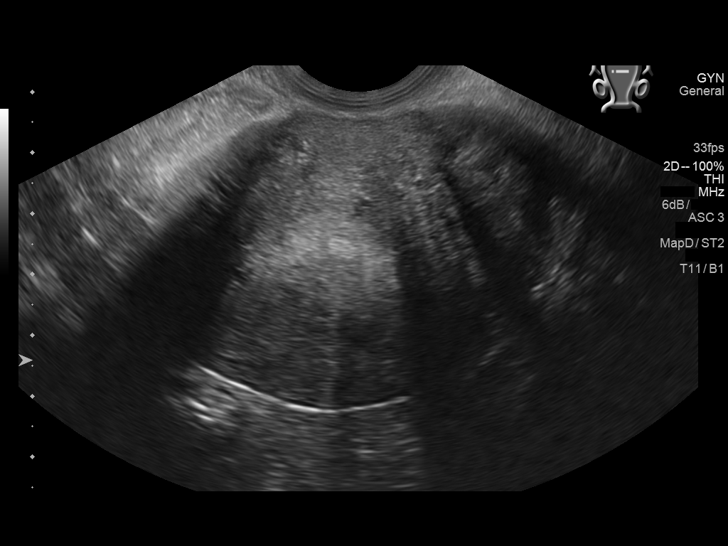
[im 45/99]
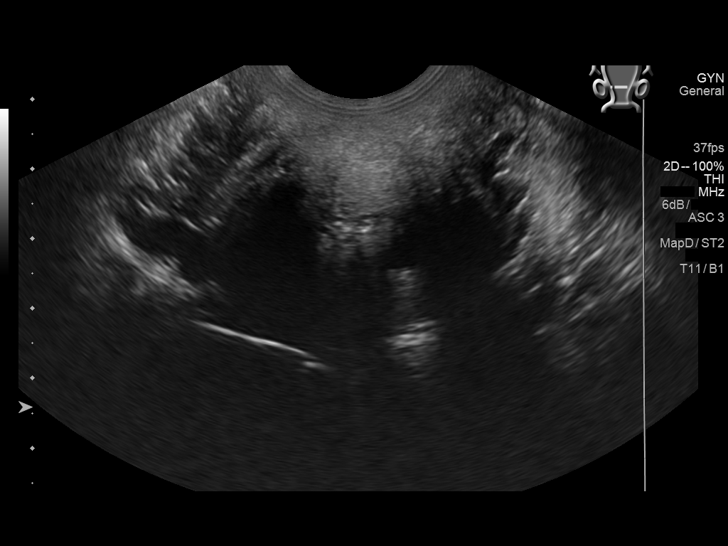
[im 54/99]
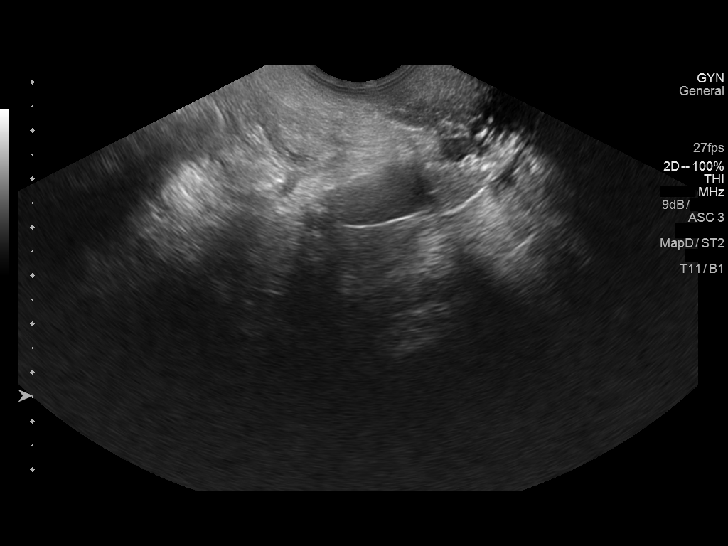
[im 62/99]
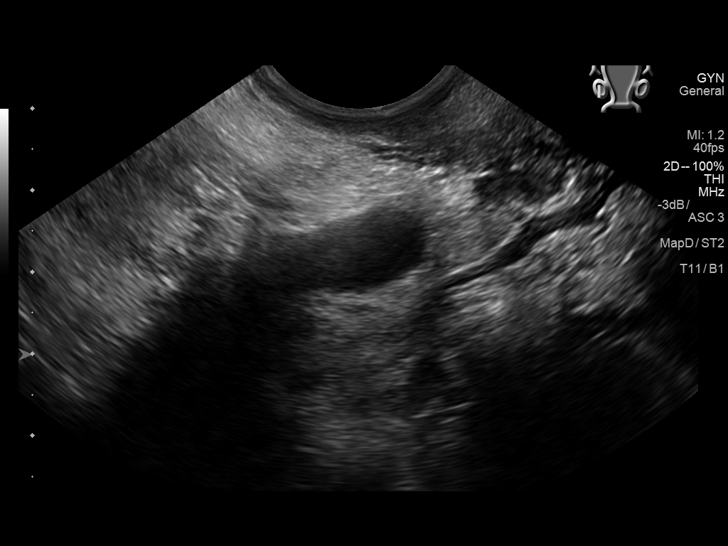
[im 66/99]
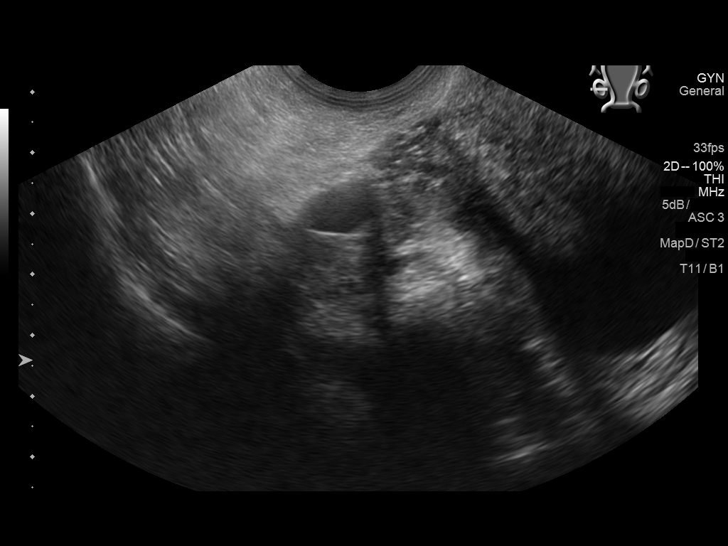
[im 74/99]
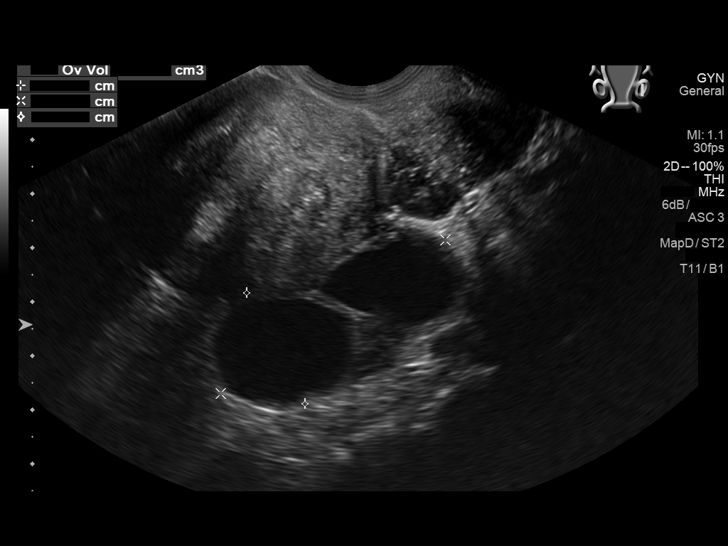
[im 82/99]
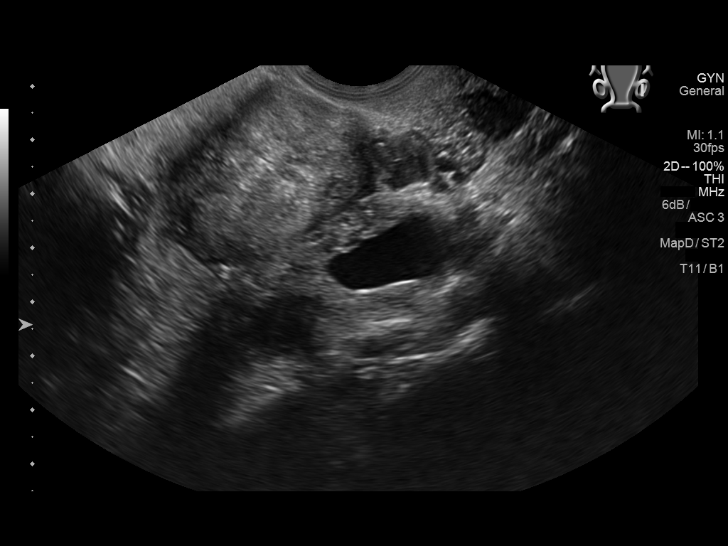
[im 90/99]
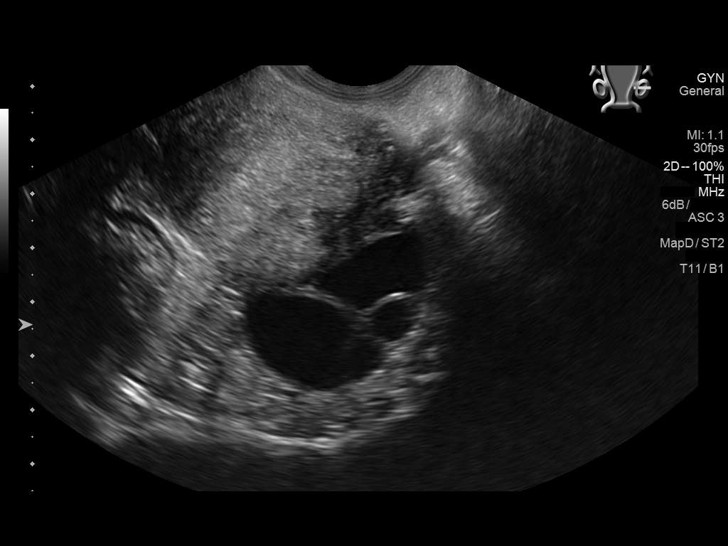
[im 99/99]
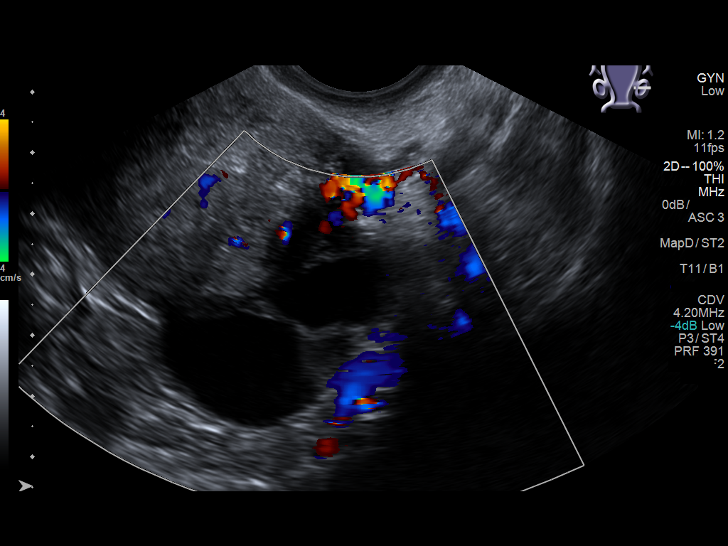

[14 of 25 positions shown; findings below may reference images not displayed]

FINDINGS: Uterus

Measurements: 8.5 x 4.8 x 6.7 cm. 2.2 x 3.3 x 2.1 cm intramural
fibroid in the left uterine fundus.

Endometrium

Thickness: 7 mm.  No focal abnormality visualized.

Right ovary

Measurements: 2.5 x 1.6 x 2.3 cm. Normal appearance/no adnexal mass.

Left ovary

Measurements: 5.0 x 2.3 x 3.4 cm. Two simple cysts, measuring 3.9 x
2.0 x 2.7 cm and 3.1 x 1.7 x 2.3 cm.

Other findings

No abnormal free fluid.
IMPRESSION: Two simple left ovarian cysts, measuring up to 3.9 cm, likely
physiologic.

3.3 cm intramural fibroid in the left uterine fundus.

## 2016-11-28 IMAGING — CT CT RENAL STONE PROTOCOL
3 of 4 series · 9 of 46 positions shown, 16 images · non-contrast
Comparison: None.

CLINICAL DATA: Increasing abdominal pain, vomiting x1 since
yesterday. Low-grade fever. Left lower quadrant pain.

EXAM:
CT ABDOMEN AND PELVIS WITHOUT CONTRAST
TECHNIQUE: Multidetector CT imaging of the abdomen and pelvis was performed
following the standard protocol without IV contrast.

[Series 4: lung · axial · 0.85mm/px · z∈[-623,-543]mm · 5 of 26 slices shown, 10 images]
[im 5/26  soft-tissue]
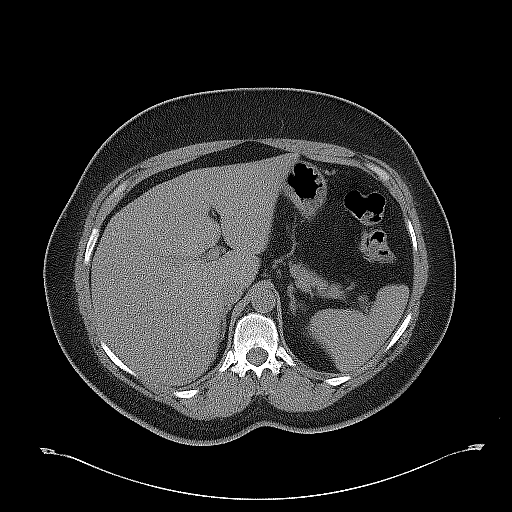
[im 5/26  bone]
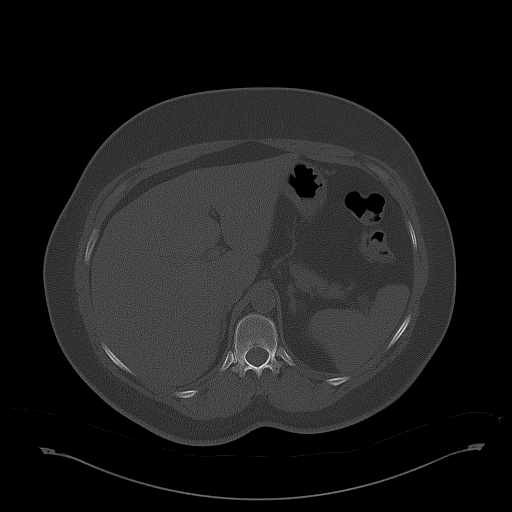
[im 9/26  soft-tissue]
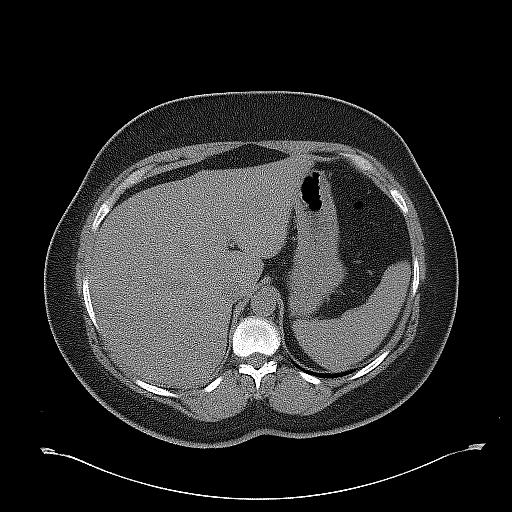
[im 9/26  lung]
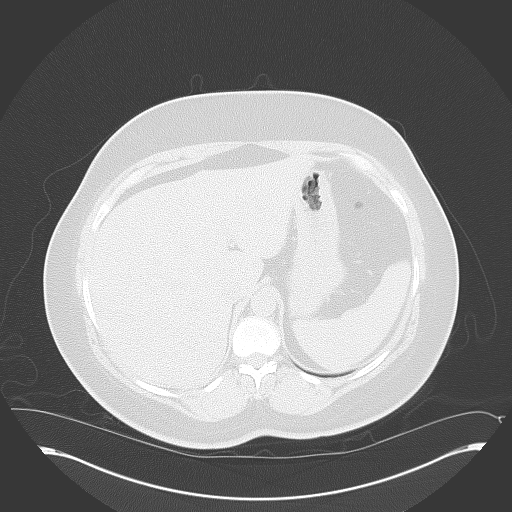
[im 13/26  soft-tissue]
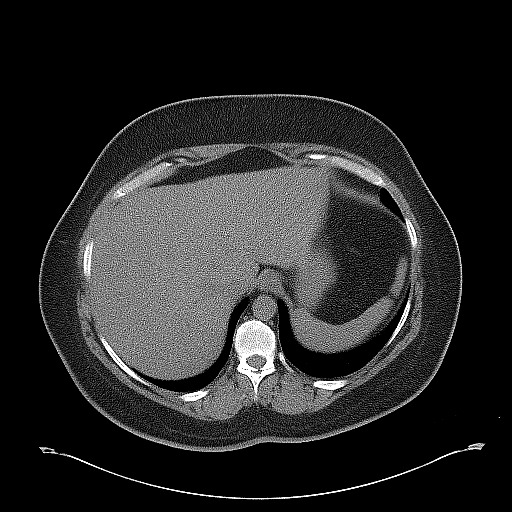
[im 13/26  lung]
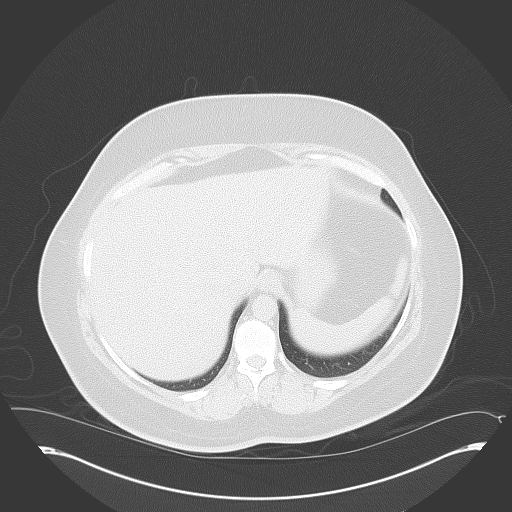
[im 17/26  soft-tissue]
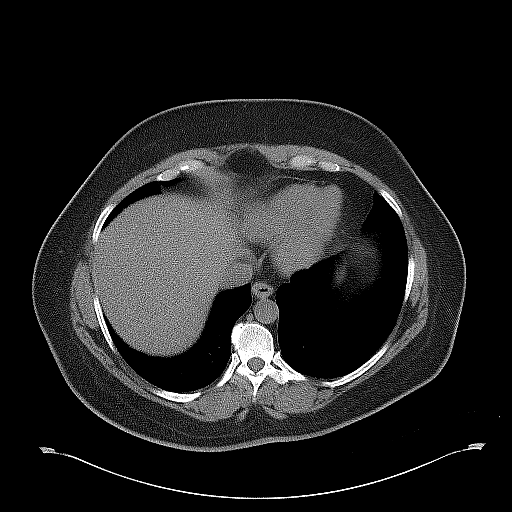
[im 17/26  lung]
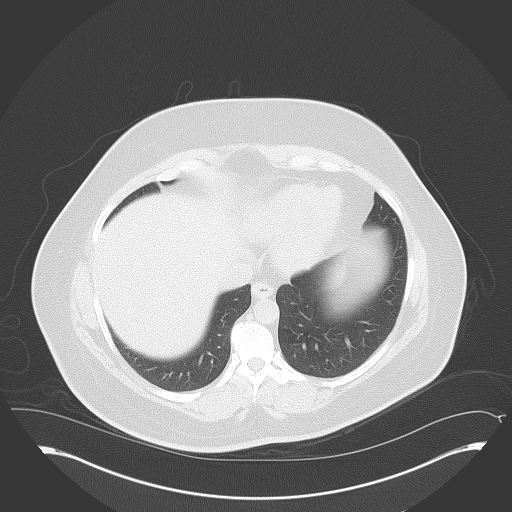
[im 21/26  soft-tissue]
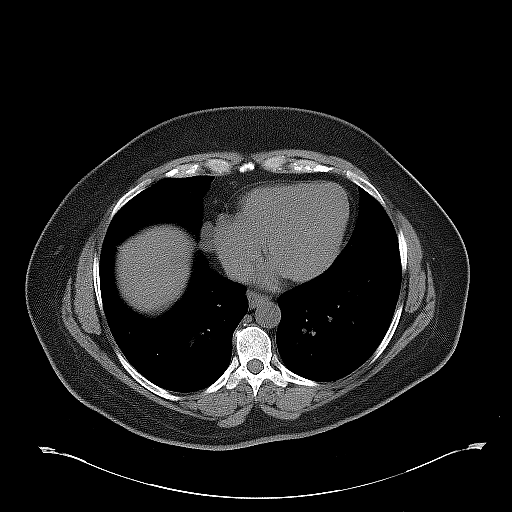
[im 21/26  lung]
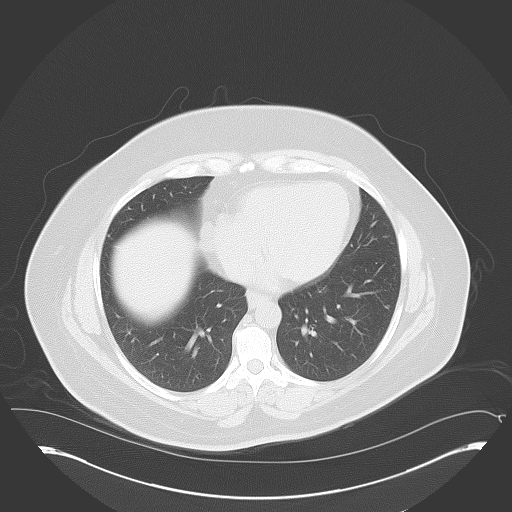

[Series 5: coronal · coronal · 0.82mm/px · 3 of 159 slices shown, 4 images]
[im 53/159  soft-tissue]
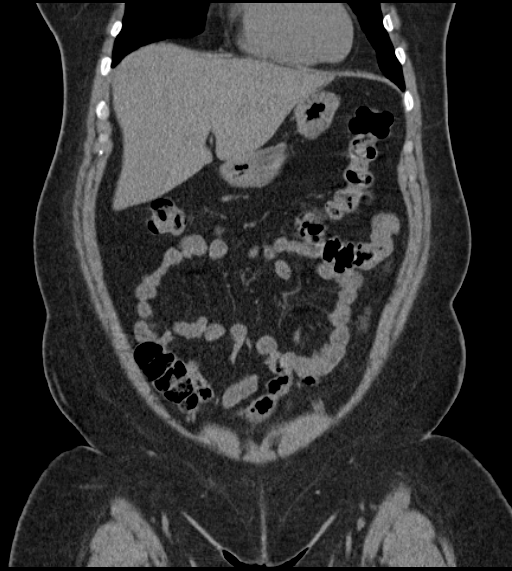
[im 71/159  soft-tissue]
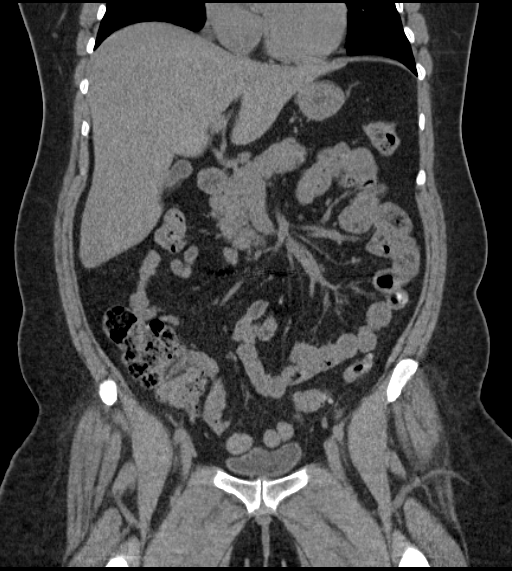
[im 71/159  bone]
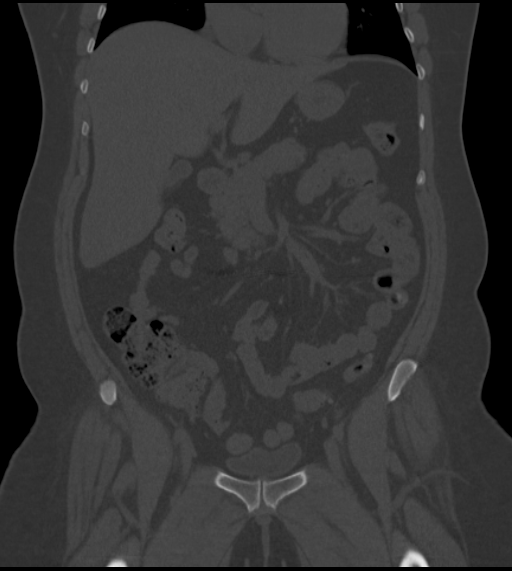
[im 88/159  soft-tissue]
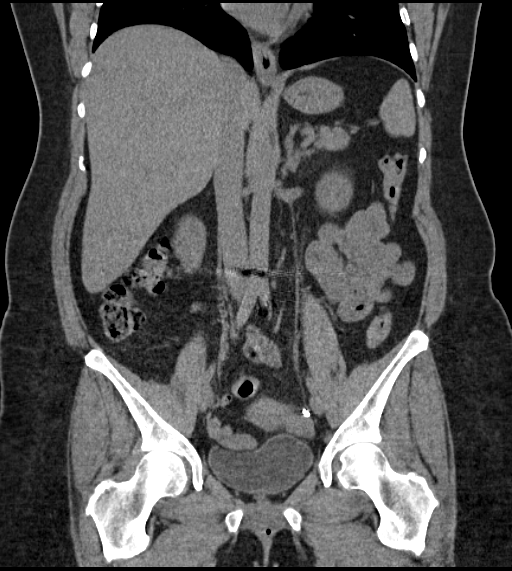

[Series 6: sagittal · sagittal · 0.62mm/px · 1 of 201 slices shown, 2 images]
[im 67/201  soft-tissue]
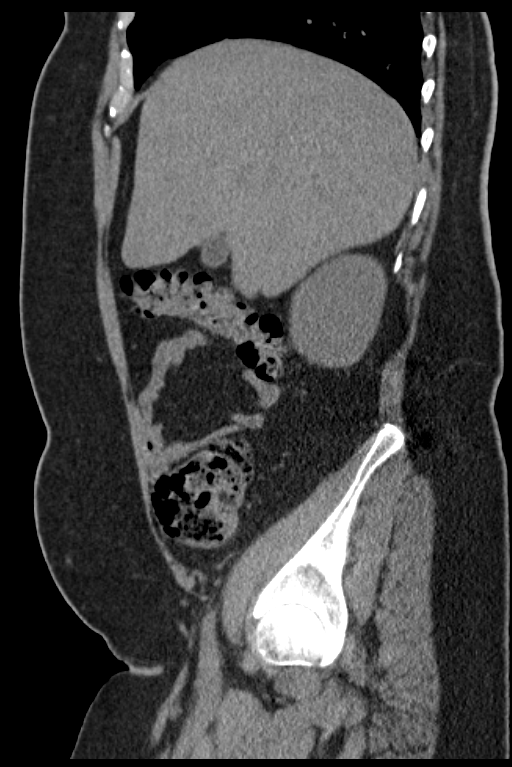
[im 67/201  bone]
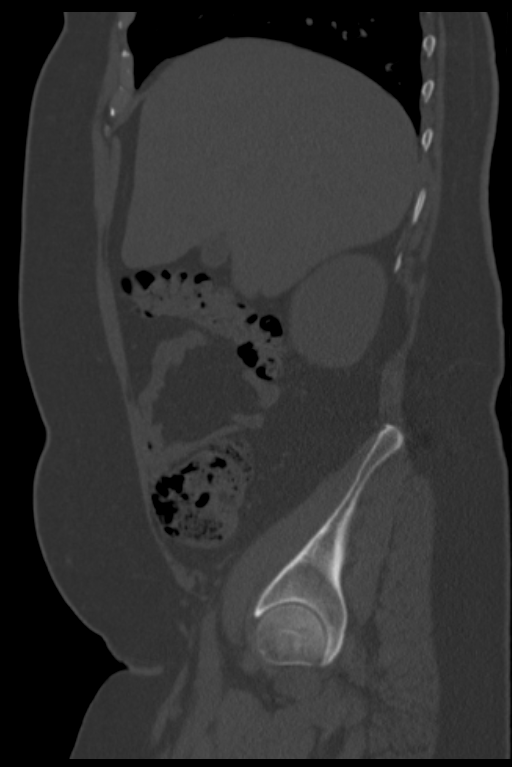

[9 of 46 positions shown; findings below may reference images not displayed]

FINDINGS: Lower chest:  No acute findings.

Hepatobiliary: Liver and gallbladder appear normal.

Pancreas: No mass or inflammatory process identified on this
un-enhanced exam.

Spleen: Within normal limits in size.

Adrenals/Urinary Tract: Adrenal glands appear normal. Kidneys appear
normal without mass, stone or hydronephrosis. No ureteral or bladder
calculi identified. Bladder appears normal.

Stomach/Bowel: Bowel is normal in caliber. Scattered diverticulosis
within the sigmoid colon without evidence of acute diverticulitis.
No bowel wall thickening or evidence of bowel wall inflammation.
Appendix appears normal. Stomach appears normal.

Vascular/Lymphatic: Abdominal aorta is normal in caliber. No
enlarged lymph nodes seen within the abdomen or pelvis.

Reproductive: Tubal ligation clips in place. Low-density mass within
the left adnexa measures 3.5 x 3.1 cm, most likely benign ovarian
cyst. No free fluid or obvious inflammatory change in the adjacent
left adnexa. Right adnexal region is unremarkable.

Other: No free fluid or abscess collections seen. No free
intraperitoneal air.

Musculoskeletal: Posterior fixation hardware old at the lumbosacral
junction, appearing appropriately positioned. No acute or suspicious
osseous finding. Superficial soft tissues are unremarkable.
IMPRESSION: 1. Left adnexal mass, measuring 3.5 x 3.1 cm, most likely benign
ovarian cyst. No free fluid or obvious inflammatory change within
the adjacent left adnexa. Given the history of left lower quadrant
pain, would consider pelvic ultrasound for confirmation.
2. Sigmoid colon diverticulosis without evidence of acute
diverticulitis.
3. Remainder of the abdomen and pelvis CT is unremarkable. No bowel
obstruction. No renal or ureteral calculi. No free fluid.

## 2017-06-19 ENCOUNTER — Telehealth: Payer: Self-pay

## 2017-06-19 MED ORDER — MEDROXYPROGESTERONE ACETATE 10 MG PO TABS: 20.0000 mg | ORAL_TABLET | Freq: Every day | ORAL | 0 refills | Status: DC

## 2017-06-19 NOTE — Telephone Encounter (Signed)
Pt requesting refills for provera 10mg . Advised due for annual exam at the end of this month. Sending one month supply. Pt aware.

## 2017-07-18 ENCOUNTER — Other Ambulatory Visit: Payer: Self-pay | Admitting: Obstetrics and Gynecology

## 2017-07-21 NOTE — Telephone Encounter (Signed)
Annual is scheduled for 10/1

## 2017-07-23 ENCOUNTER — Other Ambulatory Visit: Payer: Self-pay | Admitting: Obstetrics and Gynecology

## 2017-08-04 ENCOUNTER — Encounter: Payer: Self-pay | Admitting: Obstetrics and Gynecology

## 2017-08-04 ENCOUNTER — Ambulatory Visit (INDEPENDENT_AMBULATORY_CARE_PROVIDER_SITE_OTHER): Payer: Medicare Other | Admitting: Obstetrics and Gynecology

## 2017-08-04 DIAGNOSIS — Z01419 Encounter for gynecological examination (general) (routine) without abnormal findings: Secondary | ICD-10-CM

## 2017-08-04 DIAGNOSIS — Z1231 Encounter for screening mammogram for malignant neoplasm of breast: Secondary | ICD-10-CM

## 2017-08-04 DIAGNOSIS — Z1211 Encounter for screening for malignant neoplasm of colon: Secondary | ICD-10-CM

## 2017-08-04 DIAGNOSIS — Z124 Encounter for screening for malignant neoplasm of cervix: Secondary | ICD-10-CM | POA: Diagnosis not present

## 2017-08-04 DIAGNOSIS — Z1239 Encounter for other screening for malignant neoplasm of breast: Secondary | ICD-10-CM

## 2017-08-04 NOTE — Progress Notes (Signed)
Patient ID: Leslie Zamora, female   DOB: 1971-07-30, 46 y.o.   MRN: [id number]    Gynecology Annual Exam  PCP: Patient, No Pcp Per  Chief Complaint:  Chief Complaint  Patient presents with  . Gynecologic Exam    Hair falling out/no sex drive/discuss quitting smoking    History of Present Illness: Patient is a 46 y.o. G5P0 presents for annual exam. The patient has no complaints today.   LMP: No LMP recorded. Patient is not currently having periods (Reason: Oral contraceptives). No breakthrough bleeding on po provera  The patient is sexually active. She currently uses provera for contraception and cycle supression. She denies dyspareunia.  The patient does perform self breast exams.  There is no known family history of breast or ovarian cancer in her family, patient is adopted.  The patient wears seatbelts: yes.   The patient has regular exercise: not asked.   The patient denies current symptoms of depression.  Has noted low sex drive which she does find bothersome.  No emotional or other factors to pinpoint.  Is interested in possible treatment opportunities.  No vasomotor symptoms or other menopausal symptoms    Review of Systems: Review of Systems  Constitutional: Negative for chills and fever.  HENT: Negative for congestion.   Respiratory: Negative for cough and shortness of breath.   Cardiovascular: Negative for chest pain and palpitations.  Gastrointestinal: Negative for abdominal pain, constipation, diarrhea, heartburn, nausea and vomiting.  Genitourinary: Negative for dysuria, frequency and urgency.  Skin: Negative for itching and rash.  Neurological: Negative for dizziness and headaches.  Endo/Heme/Allergies: Negative for polydipsia.  Psychiatric/Behavioral: Negative for depression.    Past Medical History:  Past Medical History:  Diagnosis Date  . Hypertension   . Mitral valve prolapse     Past Surgical History:  Past Surgical History:  Procedure Laterality  Date  . LUMBAR FUSION  4098,1191, 2014, 2017  . MANDIBLE SURGERY  1989  . NASAL SEPTUM SURGERY    . TONSILLECTOMY    . TUBAL LIGATION  2001    Gynecologic History:  No LMP recorded. Patient is not currently having periods (Reason: Oral contraceptives). Contraception: oral progesterone-only contraceptive Last Pap: Results were: 07/01/2016 NIL  Endometrial biopsy: 06/04/2016 weakly proliferative endometrium Last mammogram: not on recrord Obstetric History: G5P0  Family History:  Family History  Problem Relation Age of Onset  . Adopted: Yes    Social History:  Social History   Social History  . Marital status: Married    Spouse name: N/A  . Number of children: N/A  . Years of education: N/A   Occupational History  . Not on file.   Social History Main Topics  . Smoking status: Current Every Day Smoker    Packs/day: 1.00  . Smokeless tobacco: Never Used  . Alcohol use Yes     Comment: occ  . Drug use: No  . Sexual activity: Yes    Birth control/ protection: Surgical   Other Topics Concern  . Not on file   Social History Narrative  . No narrative on file    Allergies:  Allergies  Allergen Reactions  . Other Shortness Of Breath and Swelling    "filler in the gabapentin" currently taking gabapentin from a different drug manufactory "Low reaction"   . Clindamycin/Lincomycin Itching  . Gabapentin     Medications: Prior to Admission medications   Medication Sig Start Date End Date Taking? Authorizing Provider  atorvastatin (LIPITOR) 10 MG tablet Take 20 mg  by mouth.   Yes [provider]  BIOTIN PO Take 1 capsule by mouth daily.   Yes [provider]  cetirizine (ZYRTEC) 10 MG tablet Take 10 mg by mouth daily.   Yes [provider]  citalopram (CELEXA) 40 MG tablet Take 40 mg by mouth daily. 07/14/17  Yes [provider]  fluticasone (FLONASE) 50 MCG/ACT nasal spray USE 1 SQUIRT IN EACH NOSTRIL DAILY 05/23/17  Yes [provider]  gabapentin (NEURONTIN) 400 MG capsule Take 800 mg by mouth 3 (three) times daily. MAY TAKE TWO ADDITIONAL IF NEEDED 01/23/15  Yes [provider]  hydrochlorothiazide (HYDRODIURIL) 25 MG tablet Take 25 mg by mouth daily. 07/17/17  Yes [provider]  medroxyPROGESTERone (PROVERA) 10 MG tablet TAKE 2 TABLETS BY MOUTH EVERY DAY 07/21/17  Yes Malachy Mood, MD  methocarbamol (ROBAXIN) 500 MG tablet Take 1 tablet (500 mg total) by mouth 3 (three) times daily. 02/24/15  Yes Triplett, Tammy, PA-C  Multiple Vitamin (MULTIVITAMIN WITH MINERALS) TABS tablet Take 1 tablet by mouth daily.   Yes [provider]    Physical Exam Vitals: Blood pressure 112/72, pulse 73, height 5' 4.5" (1.638 m), weight 228 lb (103.4 kg).  General: NAD HEENT: normocephalic, anicteric Thyroid: no enlargement, no palpable nodules Pulmonary: No increased work of breathing, CTAB Cardiovascular: RRR, distal pulses 2+ Breast: Breast symmetrical, no tenderness, no palpable nodules or masses, no skin or nipple retraction present, no nipple discharge.  No axillary or supraclavicular lymphadenopathy. Abdomen: NABS, soft, non-tender, non-distended.  Umbilicus without lesions.  No hepatomegaly, splenomegaly or masses palpable. No evidence of hernia  Genitourinary:  External: Normal external female genitalia.  Normal urethral meatus, normal  Bartholin's and Skene's glands.    Vagina: Normal vaginal mucosa, no evidence of prolapse.    Cervix: Grossly normal in appearance, no bleeding  Uterus: Non-enlarged, mobile, normal contour.  No CMT  Adnexa: ovaries non-enlarged, no adnexal masses  Rectal: deferred  Lymphatic: no evidence of inguinal lymphadenopathy Extremities: no edema, erythema, or tenderness Neurologic: Grossly intact Psychiatric: mood appropriate, affect full  Female chaperone present for pelvic and breast  portions of the physical exam    Assessment: 46 y.o. G5P0 routine  annual exam  Plan: Problem List Items Addressed This Visit    None    Visit Diagnoses    Screening for malignant neoplasm of cervix       Breast screening       Relevant Orders   MM DIGITAL SCREENING BILATERAL   Special screening for malignant neoplasms, colon       Relevant Orders   Ambulatory referral to Gastroenterology   Encounter for gynecological examination without abnormal finding          1) Mammogram - recommend yearly screening mammogram.  Mammogram Was ordered today   2) STI screening was offered and declined  3) ASCCP guidelines and rational discussed.  Patient opts for every 3 years screening interval  4) Contraception - continue provera  5) Colonoscopy -- Screening recommended starting at age 11 for average risk individuals, age 68 for individuals deemed at increased risk (including African Americans) and recommended to continue until age 57.  For patient age 5-85 individualized approach is recommended.  Gold standard screening is via colonoscopy, Cologuard screening is an acceptable alternative for patient unwilling or unable to undergo colonoscopy.  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  After discussing dosing, side-effects and therapeutic effects patient not interested in starting

## 2017-09-21 ENCOUNTER — Other Ambulatory Visit: Payer: Self-pay | Admitting: Obstetrics and Gynecology

## 2017-10-03 ENCOUNTER — Telehealth: Payer: Self-pay

## 2017-10-03 ENCOUNTER — Other Ambulatory Visit: Payer: Self-pay

## 2017-10-03 DIAGNOSIS — Z1211 Encounter for screening for malignant neoplasm of colon: Secondary | ICD-10-CM

## 2017-10-03 NOTE — Telephone Encounter (Signed)
Gastroenterology Pre-Procedure Review  Request Date:  Requesting Physician: Dr.   PATIENT REVIEW QUESTIONS: The patient responded to the following health history questions as indicated:    1. Are you having any GI issues? no 2. Do you have a personal history of Polyps? no 3. Do you have a family history of Colon Cancer or Polyps? Unknown, pt adopted. 4. Diabetes Mellitus? no 5. Joint replacements in the past 12 months?no 6. Major health problems in the past 3 months?no 7. Any artificial heart valves, MVP, or defibrillator?no    MEDICATIONS & ALLERGIES:    Patient reports the following regarding taking any anticoagulation/antiplatelet therapy:   Plavix, Coumadin, Eliquis, Xarelto, Lovenox, Pradaxa, Brilinta, or Effient? no Aspirin? no  Patient confirms/reports the following medications:  Current Outpatient Medications  Medication Sig Dispense Refill  . atorvastatin (LIPITOR) 10 MG tablet Take 20 mg by mouth.    Marland Kitchen BIOTIN PO Take 1 capsule by mouth daily.    . cetirizine (ZYRTEC) 10 MG tablet Take 10 mg by mouth daily.    . citalopram (CELEXA) 40 MG tablet Take 40 mg by mouth daily.  0  . fluticasone (FLONASE) 50 MCG/ACT nasal spray USE 1 SQUIRT IN EACH NOSTRIL DAILY  0  . gabapentin (NEURONTIN) 400 MG capsule Take 800 mg by mouth 3 (three) times daily. MAY TAKE TWO ADDITIONAL IF NEEDED    . hydrochlorothiazide (HYDRODIURIL) 25 MG tablet Take 25 mg by mouth daily.  6  . medroxyPROGESTERone (PROVERA) 10 MG tablet TAKE 2 TABLETS BY MOUTH EVERY DAY 60 tablet 0  . medroxyPROGESTERone (PROVERA) 10 MG tablet TAKE 2 TABLETS BY MOUTH EVERY DAY 60 tablet 11  . methocarbamol (ROBAXIN) 500 MG tablet Take 1 tablet (500 mg total) by mouth 3 (three) times daily. 21 tablet 0  . Multiple Vitamin (MULTIVITAMIN WITH MINERALS) TABS tablet Take 1 tablet by mouth daily.     No current facility-administered medications for this visit.     Patient confirms/reports the following allergies:  Allergies   Allergen Reactions  . Other Shortness Of Breath and Swelling    "filler in the gabapentin" currently taking gabapentin from a different drug manufactory "Low reaction"   . Clindamycin/Lincomycin Itching  . Gabapentin     No orders of the defined types were placed in this encounter.   AUTHORIZATION INFORMATION Primary Insurance: 1D#: Group #:  Secondary Insurance: 1D#: Group #:  SCHEDULE INFORMATION: Date: 10/17/17 Time: Location: [facility]

## 2017-10-17 ENCOUNTER — Encounter
Admission: RE | Disposition: A | Discharge status: Home / Self Care | Payer: Self-pay | Source: Ambulatory Visit | Attending: Gastroenterology

## 2017-10-17 ENCOUNTER — Encounter: Payer: Self-pay | Admitting: *Deleted

## 2017-10-17 ENCOUNTER — Ambulatory Visit: Payer: Medicare Other | Admitting: Anesthesiology

## 2017-10-17 ENCOUNTER — Ambulatory Visit
Admission: RE | Admit: 2017-10-17 | Discharge: 2017-10-17 | Disposition: A | Discharge status: Home / Self Care | Payer: Medicare Other | Source: Ambulatory Visit | Attending: Gastroenterology | Admitting: Gastroenterology

## 2017-10-17 DIAGNOSIS — Z79899 Other long term (current) drug therapy: Secondary | ICD-10-CM | POA: Diagnosis not present

## 2017-10-17 DIAGNOSIS — Z7951 Long term (current) use of inhaled steroids: Secondary | ICD-10-CM | POA: Diagnosis not present

## 2017-10-17 DIAGNOSIS — D120 Benign neoplasm of cecum: Secondary | ICD-10-CM | POA: Diagnosis not present

## 2017-10-17 DIAGNOSIS — I341 Nonrheumatic mitral (valve) prolapse: Secondary | ICD-10-CM | POA: Insufficient documentation

## 2017-10-17 DIAGNOSIS — Z888 Allergy status to other drugs, medicaments and biological substances status: Secondary | ICD-10-CM | POA: Insufficient documentation

## 2017-10-17 DIAGNOSIS — Z881 Allergy status to other antibiotic agents status: Secondary | ICD-10-CM | POA: Insufficient documentation

## 2017-10-17 DIAGNOSIS — I10 Essential (primary) hypertension: Secondary | ICD-10-CM | POA: Diagnosis not present

## 2017-10-17 DIAGNOSIS — F17210 Nicotine dependence, cigarettes, uncomplicated: Secondary | ICD-10-CM | POA: Diagnosis not present

## 2017-10-17 DIAGNOSIS — K5730 Diverticulosis of large intestine without perforation or abscess without bleeding: Secondary | ICD-10-CM | POA: Insufficient documentation

## 2017-10-17 DIAGNOSIS — Z1211 Encounter for screening for malignant neoplasm of colon: Secondary | ICD-10-CM

## 2017-10-17 HISTORY — PX: COLONOSCOPY WITH PROPOFOL: SHX5780

## 2017-10-17 LAB — POCT PREGNANCY, URINE: Preg Test, Ur: NEGATIVE

## 2017-10-17 SURGERY — COLONOSCOPY WITH PROPOFOL
Anesthesia: General

## 2017-10-17 MED ORDER — PROPOFOL 500 MG/50ML IV EMUL
INTRAVENOUS | Status: AC
  Filled 2017-10-17: qty 50

## 2017-10-17 MED ORDER — PROPOFOL 500 MG/50ML IV EMUL
INTRAVENOUS | Status: DC | PRN
  Administered 2017-10-17: 160 ug/kg/min via INTRAVENOUS

## 2017-10-17 MED ORDER — PROPOFOL 10 MG/ML IV BOLUS
INTRAVENOUS | Status: DC | PRN
  Administered 2017-10-17: 70 mg via INTRAVENOUS
  Administered 2017-10-17 (×2): 30 mg via INTRAVENOUS

## 2017-10-17 MED ORDER — SODIUM CHLORIDE 0.9 % IV SOLN
INTRAVENOUS | Status: DC
  Administered 2017-10-17: 1000 mL via INTRAVENOUS

## 2017-10-17 NOTE — Anesthesia Procedure Notes (Signed)
Performed by: Analyce Tavares, CRNA Pre-anesthesia Checklist: Emergency Drugs available, Patient identified, Suction available, Patient being monitored and Timeout performed Patient Re-evaluated:Patient Re-evaluated prior to induction Oxygen Delivery Method: Nasal cannula Induction Type: IV induction       

## 2017-10-17 NOTE — Anesthesia Post-op Follow-up Note (Signed)
Anesthesia QCDR form completed.        

## 2017-10-17 NOTE — Op Note (Addendum)
[facility] Gastroenterology Patient Name: Leslie Zamora Procedure Date: 10/17/2017 9:55 AM MRN: [id number] Account #: [id number] Date of Birth: Feb 16, 1971 Admit Type: Outpatient Age: 46 Room: [facility] ENDO ROOM 4 Gender: Female Note Status: Finalized Procedure:            Colonoscopy Indications:          Screening for colorectal malignant neoplasm Providers:            [name] MD, MD Referring MD:         Nicholes Rough (Referring MD) Medicines:            Monitored Anesthesia Care Complications:        No immediate complications. Procedure:            Pre-Anesthesia Assessment:                       - Prior to the procedure, a History and Physical was                        performed, and patient medications, allergies and                        sensitivities were reviewed. The patient's tolerance of                        previous anesthesia was reviewed.                       - The risks and benefits of the procedure and the                        sedation options and risks were discussed with the                        patient. All questions were answered and informed                        consent was obtained.                       - ASA Grade Assessment: II - A patient with mild                        systemic disease.                       After obtaining informed consent, the colonoscope was                        passed under direct vision. Throughout the procedure,                        the patient's blood pressure, pulse, and oxygen                        saturations were monitored continuously. The                        Colonoscope was introduced through the anus and  advanced to the the cecum, identified by the                        appendiceal orifice, IC valve and transillumination.                        The colonoscopy was performed with ease. The patient                        tolerated the procedure well. The quality of  the bowel                        preparation was good. Findings:      The perianal and digital rectal examinations were normal.      A 5 mm polyp was found in the cecum. The polyp was sessile. The polyp       was removed with a cold biopsy forceps. Resection and retrieval were       complete.      A 6 mm polyp was found in the transverse colon. The polyp was sessile.       The polyp was removed with a cold snare. Resection was complete, but the       polyp tissue was not retrieved.      A few small-mouthed diverticula were found in the sigmoid colon.      The exam was otherwise without abnormality on direct and retroflexion       views. Impression:           - One 5 mm polyp in the cecum, removed with a cold                        biopsy forceps. Resected and retrieved.                       - One 6 mm polyp in the transverse colon, removed with                        a cold snare. Complete resection. Polyp tissue not                        retrieved.                       - Diverticulosis in the sigmoid colon.                       - The examination was otherwise normal on direct and                        retroflexion views. Recommendation:       - Discharge patient to home (with escort).                       - Resume previous diet.                       - Continue present medications.                       - Await pathology results.                       -  Repeat colonoscopy in 5 years for surveillance. Procedure Code(s):    --- Professional ---                       [id number], Colonoscopy, flexible; with removal of tumor(s),                        polyp(s), or other lesion(s) by snare technique                       45380, 54, Colonoscopy, flexible; with biopsy, single                        or multiple Diagnosis Code(s):    --- Professional ---                       Z12.11, Encounter for screening for malignant neoplasm                        of colon                       D12.0,  Benign neoplasm of cecum                       D12.3, Benign neoplasm of transverse colon (hepatic                        flexure or splenic flexure)                       K57.30, Diverticulosis of large intestine without                        perforation or abscess without bleeding CPT copyright 2016 American Medical Association. All rights reserved. The codes documented in this report are preliminary and upon coder review may  be revised to meet current compliance requirements. [name], MD [name] MD, MD 10/17/2017 10:20:57 AM This report has been signed electronically. Number of Addenda: 0 Note Initiated On: 10/17/2017 9:55 AM Scope Withdrawal Time: 0 hours 16 minutes 35 seconds  Total Procedure Duration: 0 hours 18 minutes 49 seconds       [facility]

## 2017-10-17 NOTE — Anesthesia Preprocedure Evaluation (Signed)
Anesthesia Evaluation  Patient identified by MRN, date of birth, ID band Patient awake    Reviewed: Allergy & Precautions, NPO status , Patient's Chart, lab work & pertinent test results  History of Anesthesia Complications Negative for: history of anesthetic complications  Airway Mallampati: III  TM Distance: >3 FB Neck ROM: Full    Dental no notable dental hx.    Pulmonary neg sleep apnea, neg COPD, Current Smoker,    breath sounds clear to auscultation- rhonchi (-) wheezing      Cardiovascular Exercise Tolerance: Good hypertension, Pt. on medications (-) CAD, (-) Past MI, (-) Cardiac Stents and (-) CABG  Rhythm:Regular Rate:Normal - Systolic murmurs and - Diastolic murmurs    Neuro/Psych negative neurological ROS  negative psych ROS   GI/Hepatic negative GI ROS, Neg liver ROS,   Endo/Other  negative endocrine ROSneg diabetes  Renal/GU negative Renal ROS     Musculoskeletal negative musculoskeletal ROS (+)   Abdominal (+) + obese,   Peds  Hematology negative hematology ROS (+)   Anesthesia Other Findings Past Medical History: No date: Hypertension No date: Mitral valve prolapse   Reproductive/Obstetrics                             Anesthesia Physical Anesthesia Plan  ASA: II  Anesthesia Plan: General   Post-op Pain Management:    Induction: Intravenous  PONV Risk Score and Plan: 2 and Propofol infusion  Airway Management Planned: Natural Airway  Additional Equipment:   Intra-op Plan:   Post-operative Plan:   Informed Consent: I have reviewed the patients History and Physical, chart, labs and discussed the procedure including the risks, benefits and alternatives for the proposed anesthesia with the patient or authorized representative who has indicated his/her understanding and acceptance.   Dental advisory given  Plan Discussed with: CRNA and  Anesthesiologist  Anesthesia Plan Comments:         Anesthesia Quick Evaluation

## 2017-10-17 NOTE — Transfer of Care (Signed)
Immediate Anesthesia Transfer of Care Note  Patient: Leslie Zamora  Procedure(s) Performed: COLONOSCOPY WITH PROPOFOL (N/A )  Patient Location: PACU  Anesthesia Type:General  Level of Consciousness: awake, alert  and oriented  Airway & Oxygen Therapy: Patient Spontanous Breathing and Patient connected to nasal cannula oxygen  Post-op Assessment: Report given to RN and Post -op Vital signs reviewed and stable  Post vital signs: Reviewed and stable  Last Vitals:  Vitals:   10/17/17 0915  BP: 130/72  Pulse: 77  Resp: 20  Temp: (!) 36 C  SpO2: 100%    Last Pain:  Vitals:   10/17/17 0915  TempSrc: Tympanic         Complications: No apparent anesthesia complications

## 2017-10-17 NOTE — H&P (Signed)
Leslie Bellows, MD [address], [place], [place], [place], [address] [address], [place], [place], [place], [address] Phone: [phone]  Fax: [phone]  Primary Care Physician:  Nicholes Rough, PA-C   Pre-Procedure History & Physical: HPI:  Leslie Zamora is a 46 y.o. female is here for an colonoscopy.   Past Medical History:  Diagnosis Date  . Hypertension   . Mitral valve prolapse     Past Surgical History:  Procedure Laterality Date  . LUMBAR FUSION  2637,8588, 2014, 2017  . MANDIBLE SURGERY  1989  . NASAL SEPTUM SURGERY    . TONSILLECTOMY    . TUBAL LIGATION  2001    Prior to Admission medications   Medication Sig Start Date End Date Taking? Authorizing Provider  gabapentin (NEURONTIN) 400 MG capsule Take 800 mg by mouth 3 (three) times daily. MAY TAKE TWO ADDITIONAL IF NEEDED 01/23/15  Yes [provider]  hydrochlorothiazide (HYDRODIURIL) 25 MG tablet Take 25 mg by mouth daily. 07/17/17  Yes [provider]  atorvastatin (LIPITOR) 10 MG tablet Take 20 mg by mouth.    [provider]  BIOTIN PO Take 1 capsule by mouth daily.    [provider]  cetirizine (ZYRTEC) 10 MG tablet Take 10 mg by mouth daily.    [provider]  fluticasone (FLONASE) 50 MCG/ACT nasal spray USE 1 SQUIRT IN EACH NOSTRIL DAILY 05/23/17   [provider]  medroxyPROGESTERone (PROVERA) 10 MG tablet TAKE 2 TABLETS BY MOUTH EVERY DAY 07/21/17   Malachy Mood, MD  medroxyPROGESTERone (PROVERA) 10 MG tablet TAKE 2 TABLETS BY MOUTH EVERY DAY 09/21/17   Malachy Mood, MD  methocarbamol (ROBAXIN) 500 MG tablet Take 1 tablet (500 mg total) by mouth 3 (three) times daily. 02/24/15   Triplett, Tammy, PA-C  Multiple Vitamin (MULTIVITAMIN WITH MINERALS) TABS tablet Take 1 tablet by mouth daily.    [provider]    Allergies as of 10/03/2017 - Review Complete 08/04/2017  Allergen Reaction Noted  . Other Shortness Of Breath  and Swelling 02/24/2015  . Clindamycin/lincomycin Itching 11/29/2015  . Gabapentin  02/24/2015    Family History  Adopted: Yes    Social History   Socioeconomic History  . Marital status: Married    Spouse name: Not on file  . Number of children: Not on file  . Years of education: Not on file  . Highest education level: Not on file  Social Needs  . Financial resource strain: Not on file  . Food insecurity - worry: Not on file  . Food insecurity - inability: Not on file  . Transportation needs - medical: Not on file  . Transportation needs - non-medical: Not on file  Occupational History  . Not on file  Tobacco Use  . Smoking status: Current Every Day Smoker    Packs/day: 1.00  . Smokeless tobacco: Never Used  Substance and Sexual Activity  . Alcohol use: Yes    Comment: occ  . Drug use: No  . Sexual activity: Yes    Birth control/protection: Surgical  Other Topics Concern  . Not on file  Social History Narrative  . Not on file    Review of Systems: See HPI, otherwise negative ROS  Physical Exam: BP 130/72   Pulse 77   Temp (!) 96.8 F (36 C) (Tympanic)   Resp 20   Ht 5' 4.5" (1.638 m)   Wt 220 lb (99.8 kg)   SpO2 100%  BMI 37.18 kg/m  General:   Alert,  pleasant and cooperative in NAD Head:  Normocephalic and atraumatic. Neck:  Supple; no masses or thyromegaly. Lungs:  Clear throughout to auscultation, normal respiratory effort.    Heart:  +S1, +S2, Regular rate and rhythm, No edema. Abdomen:  Soft, nontender and nondistended. Normal bowel sounds, without guarding, and without rebound.   Neurologic:  Alert and  oriented x4;  grossly normal neurologically.  Impression/Plan: Leslie Zamora is here for an colonoscopy to be performed for Screening colonoscopy average risk   Risks, benefits, limitations, and alternatives regarding  colonoscopy have been reviewed with the patient.  Questions have been answered.  All parties agreeable.   Leslie Bellows,  MD  10/17/2017, 9:52 AM

## 2017-10-17 NOTE — Anesthesia Postprocedure Evaluation (Signed)
Anesthesia Post Note  Patient: Leslie Zamora  Procedure(s) Performed: COLONOSCOPY WITH PROPOFOL (N/A )  Patient location during evaluation: Endoscopy Anesthesia Type: General Level of consciousness: awake and alert and oriented Pain management: pain level controlled Vital Signs Assessment: post-procedure vital signs reviewed and stable Respiratory status: spontaneous breathing, nonlabored ventilation and respiratory function stable Cardiovascular status: blood pressure returned to baseline and stable Postop Assessment: no signs of nausea or vomiting Anesthetic complications: no     Last Vitals:  Vitals:   10/17/17 1030 10/17/17 1040  BP: (!) 88/60 104/65  Pulse: 74 70  Resp: (!) 22 16  Temp:    SpO2: 98% 100%    Last Pain:  Vitals:   10/17/17 0915  TempSrc: Tympanic                 [name]

## 2017-10-17 NOTE — OR Nursing (Signed)
Cold Snared Transverse Colon Polyp not retrieved.

## 2017-10-20 LAB — SURGICAL PATHOLOGY

## 2017-10-21 ENCOUNTER — Encounter: Payer: Self-pay | Admitting: Gastroenterology

## 2017-11-06 ENCOUNTER — Encounter: Payer: Self-pay | Admitting: Gastroenterology

## 2018-07-28 ENCOUNTER — Other Ambulatory Visit: Payer: Self-pay | Admitting: Obstetrics and Gynecology

## 2018-07-28 NOTE — Telephone Encounter (Signed)
Annual exam 10/7, please advise

## 2018-08-10 ENCOUNTER — Ambulatory Visit (INDEPENDENT_AMBULATORY_CARE_PROVIDER_SITE_OTHER): Payer: Medicare Other | Admitting: Obstetrics and Gynecology

## 2018-08-10 ENCOUNTER — Other Ambulatory Visit: Payer: Self-pay | Admitting: Obstetrics and Gynecology

## 2018-08-10 ENCOUNTER — Other Ambulatory Visit (HOSPITAL_COMMUNITY)
Admission: RE | Admit: 2018-08-10 | Discharge: 2018-08-10 | Disposition: A | Discharge status: Home / Self Care | Payer: Medicare Other | Source: Ambulatory Visit | Attending: Obstetrics and Gynecology | Admitting: Obstetrics and Gynecology

## 2018-08-10 ENCOUNTER — Encounter: Payer: Self-pay | Admitting: Obstetrics and Gynecology

## 2018-08-10 VITALS — BP 128/78 | HR 85 | Ht 64.5 in | Wt 237.0 lb

## 2018-08-10 DIAGNOSIS — Z1239 Encounter for other screening for malignant neoplasm of breast: Secondary | ICD-10-CM | POA: Diagnosis not present

## 2018-08-10 DIAGNOSIS — Z124 Encounter for screening for malignant neoplasm of cervix: Secondary | ICD-10-CM | POA: Diagnosis present

## 2018-08-10 DIAGNOSIS — Z1231 Encounter for screening mammogram for malignant neoplasm of breast: Secondary | ICD-10-CM

## 2018-08-10 DIAGNOSIS — Z01419 Encounter for gynecological examination (general) (routine) without abnormal findings: Secondary | ICD-10-CM

## 2018-08-10 MED ORDER — MEDROXYPROGESTERONE ACETATE 10 MG PO TABS: 20.0000 mg | ORAL_TABLET | Freq: Every day | ORAL | 11 refills | Status: DC

## 2018-08-10 NOTE — Patient Instructions (Signed)
[facility] [address] [place] [address]  [facility]  [address] [place] [address]  Phone: [phone]  

## 2018-08-10 NOTE — Progress Notes (Signed)
Gynecology Annual Exam  PCP: Nicholes Rough, PA-C  Chief Complaint:  Chief Complaint  Patient presents with  . Gynecologic Exam    random bleeding 2 months ago    History of Present Illness: Patient is a 47 y.o. [id number] presents for annual exam. The patient has no complaints today.   LMP: No LMP recorded. (Menstrual status: Oral contraceptives). Amenorrhea of provera for AUB   The patient is sexually active. She currently uses tubal ligation for contraception. She denies dyspareunia.  The patient does perform self breast exams.  There is notable family history of breast or ovarian cancer in her family.  Sister had ovarian cancer.  She in unsure of her birth mother's cause of death.  The patient wears seatbelts: yes.   The patient has regular exercise: not asked.    Review of Systems: Review of Systems  Constitutional: Positive for malaise/fatigue. Negative for chills and fever.  HENT: Negative for congestion.   Eyes: Positive for redness.  Respiratory: Positive for shortness of breath. Negative for cough.   Cardiovascular: Positive for chest pain and leg swelling. Negative for palpitations.  Gastrointestinal: Positive for nausea and vomiting. Negative for abdominal pain, constipation, diarrhea and heartburn.  Genitourinary: Negative for dysuria, frequency and urgency.  Skin: Negative for itching and rash.  Neurological: Positive for tingling and headaches. Negative for dizziness.  Endo/Heme/Allergies: Negative for polydipsia.  Psychiatric/Behavioral: Positive for depression. Negative for hallucinations, substance abuse and suicidal ideas. The patient is nervous/anxious. The patient does not have insomnia.     Past Medical History:  Past Medical History:  Diagnosis Date  . Hypertension   . Mitral valve prolapse     Past Surgical History:  Past Surgical History:  Procedure Laterality Date  . COLONOSCOPY WITH PROPOFOL N/A 10/17/2017   Procedure: COLONOSCOPY WITH PROPOFOL;   Surgeon: [name], MD;  Location: [facility] ENDOSCOPY;  Service: Endoscopy;  Laterality: N/A;  [facility] [place], 2014, 2017  . MANDIBLE SURGERY  1989  . NASAL SEPTUM SURGERY    . TONSILLECTOMY    . TUBAL LIGATION  2001    Gynecologic History:  No LMP recorded. (Menstrual status: Oral contraceptives). Contraception: tubal ligation Last Pap: Results were: 07/01/2016 no abnormalities  Biopsy 06/03/2016 - no hyperplasia or malignancy, proliferative endometrium    Obstetric History: [id number]  Family History:  Family History  Adopted: Yes  Problem Relation Age of Onset  . Ovarian cancer Sister 52    Social History:  Social History   Socioeconomic History  . Marital status: Married    Spouse name: Not on file  . Number of children: Not on file  . Years of education: Not on file  . Highest education level: Not on file  Occupational History  . Not on file  Social Needs  . Financial resource strain: Not on file  . Food insecurity:    Worry: Not on file    Inability: Not on file  . Transportation needs:    Medical: Not on file    Non-medical: Not on file  Tobacco Use  . Smoking status: Current Every Day Smoker    Packs/day: 1.00  . Smokeless tobacco: Never Used  Substance and Sexual Activity  . Alcohol use: Yes    Comment: occ  . Drug use: No  . Sexual activity: Yes    Birth control/protection: Surgical  Lifestyle  . Physical activity:    Days per week: Not on file    Minutes per session: Not on  file  . Stress: Not on file  Relationships  . Social connections:    Talks on phone: Not on file    Gets together: Not on file    Attends religious service: Not on file    Active member of club or organization: Not on file    Attends meetings of clubs or organizations: Not on file    Relationship status: Not on file  . Intimate partner violence:    Fear of current or ex partner: Not on file    Emotionally abused: Not on file    Physically abused: Not on file      Forced sexual activity: Not on file  Other Topics Concern  . Not on file  Social History Narrative  . Not on file    Allergies:  Allergies  Allergen Reactions  . Cephalexin Other (See Comments), Cough, Itching, Rash and Shortness Of Breath  . Other Shortness Of Breath and Swelling    "filler in the gabapentin" currently taking gabapentin from a different drug manufactory "Low reaction"   . Clindamycin/Lincomycin Itching  . Gabapentin     Medications: Prior to Admission medications   Medication Sig Start Date End Date Taking? Authorizing Provider  atorvastatin (LIPITOR) 10 MG tablet Take 20 mg by mouth.   Yes [provider]  BIOTIN PO Take 1 capsule by mouth daily.   Yes [provider]  cetirizine (ZYRTEC) 10 MG tablet Take 10 mg by mouth daily.   Yes [provider]  fluticasone (FLONASE) 50 MCG/ACT nasal spray USE 1 SQUIRT IN EACH NOSTRIL DAILY 05/23/17  Yes [provider]  gabapentin (NEURONTIN) 400 MG capsule Take 800 mg by mouth 3 (three) times daily. MAY TAKE TWO ADDITIONAL IF NEEDED 01/23/15  Yes [provider]  hydrochlorothiazide (HYDRODIURIL) 25 MG tablet Take 25 mg by mouth daily. 07/17/17  Yes [provider]  medroxyPROGESTERone (PROVERA) 10 MG tablet TAKE 2 TABLETS BY MOUTH EVERY DAY 07/21/17  Yes Malachy Mood, MD  Multiple Vitamin (MULTIVITAMIN WITH MINERALS) TABS tablet Take 1 tablet by mouth daily.   Yes [provider]  tiZANidine (ZANAFLEX) 4 MG tablet  08/06/18  Yes [provider]    Physical Exam Vitals: Blood pressure 128/78, pulse 85, height 5' 4.5" (1.638 m), weight 237 lb (107.5 kg).  General: NAD HEENT: normocephalic, anicteric Thyroid: no enlargement, no palpable nodules Pulmonary: No increased work of breathing, CTAB Cardiovascular: RRR, distal pulses 2+ Breast: Breast symmetrical, no tenderness, no palpable nodules or masses, no skin or nipple retraction present, no  nipple discharge.  No axillary or supraclavicular lymphadenopathy. Abdomen: NABS, soft, non-tender, non-distended.  Umbilicus without lesions.  No hepatomegaly, splenomegaly or masses palpable. No evidence of hernia  Genitourinary:  External: Normal external female genitalia.  Normal urethral meatus, normal Bartholin's and Skene's glands.    Vagina: Normal vaginal mucosa, no evidence of prolapse.    Cervix: Grossly normal in appearance, no bleeding  Uterus: Non-enlarged, mobile, normal contour.  No CMT  Adnexa: ovaries non-enlarged, no adnexal masses  Rectal: deferred  Lymphatic: no evidence of inguinal lymphadenopathy Extremities: no edema, erythema, or tenderness Neurologic: Grossly intact Psychiatric: mood appropriate, affect full  Female chaperone present for pelvic and breast  portions of the physical exam    Assessment: 47 y.o. [id number] routine annual exam  Plan: Problem List Items Addressed This Visit    None    Visit Diagnoses    Screening for malignant neoplasm of cervix    -  Primary  Relevant Orders   Cytology - PAP   Breast screening       Encounter for gynecological examination without abnormal finding          1) Mammogram - recommend yearly screening mammogram.  Mammogram Was ordered today   2) STI screening  was notoffered and therefore not obtained  3) ASCCP guidelines and rational discussed.  Patient opts for every 3 years screening interval  4) Contraception - the patient is currently using  tubal ligation.  She is happy with her current form of contraception and plans to continue  - Provera for cycle control  5) Colonoscopy -- 10/17/17  6) Routine healthcare maintenance including cholesterol, diabetes screening discussed managed by PCP  7) Return in about 1 year (around 08/11/2019) for annual.   Malachy Mood, MD, [place] 08/10/2018, 12:59 PM

## 2018-08-11 LAB — CYTOLOGY - PAP
Adequacy: ABSENT
Diagnosis: NEGATIVE

## 2018-08-26 ENCOUNTER — Ambulatory Visit
Admission: RE | Admit: 2018-08-26 | Discharge: 2018-08-26 | Disposition: A | Discharge status: Home / Self Care | Payer: Medicare Other | Source: Ambulatory Visit | Attending: Obstetrics and Gynecology | Admitting: Obstetrics and Gynecology

## 2018-08-26 DIAGNOSIS — Z1231 Encounter for screening mammogram for malignant neoplasm of breast: Secondary | ICD-10-CM | POA: Insufficient documentation

## 2018-12-22 ENCOUNTER — Encounter: Payer: Self-pay | Admitting: Dietician

## 2018-12-22 ENCOUNTER — Encounter: Payer: Medicare HMO | Attending: Physician Assistant | Admitting: Dietician

## 2018-12-22 DIAGNOSIS — E119 Type 2 diabetes mellitus without complications: Secondary | ICD-10-CM | POA: Diagnosis not present

## 2018-12-22 NOTE — Progress Notes (Signed)
Patient was seen on 12/23/2018 for the first of a series of three diabetes self-management courses at [facility].  Patient Education Plan per assessed needs and concerns is to attend three course education program for Diabetes Self Management Education.  The following learning objectives were met by the patient during this class:  Describe diabetes, types of diabetes and pathophysiology  State some common risk factors for diabetes  Defines the role of glucose and insulin  Describe the relationship between diabetes and cardiovascular and other risks  State the members of the Healthcare Team  States the rationale for glucose monitoring and when to test  State their individual Target Range  State the importance of logging glucose readings and how to interpret the readings  Identifies A1C target  Explain the correlation between A1c and eAG values  State symptoms and treatment of high blood glucose and low blood glucose  Explain proper technique for glucose testing and identify proper sharps disposal  Handouts given during class include:  ADA Diabetes You Take Control   Meal Plan Card and carbohydrate content list  Dietary intake form  Low Sodium Flavoring Tips  Types of Fats  Dining Out  Label reading  Snack list  Planning a balanced meal  The diabetes portion plate  Diabetes Resources  A1c to eAG Conversion Chart  Blood Glucose Log  Diabetes Recommended Care Schedule  Support Group  Diabetes Success Plan  Core Class Satisfaction Survey   Follow-Up Plan:  Attend core 2

## 2018-12-29 ENCOUNTER — Ambulatory Visit: Payer: Medicare Other

## 2019-01-05 ENCOUNTER — Ambulatory Visit: Payer: Medicare Other

## 2019-01-19 ENCOUNTER — Ambulatory Visit: Payer: Medicare HMO

## 2019-01-26 ENCOUNTER — Ambulatory Visit: Payer: Medicare HMO

## 2019-03-26 ENCOUNTER — Emergency Department: Payer: Medicare HMO

## 2019-03-26 ENCOUNTER — Other Ambulatory Visit: Payer: Self-pay

## 2019-03-26 ENCOUNTER — Emergency Department
Admission: EM | Admit: 2019-03-26 | Discharge: 2019-03-26 | Disposition: A | Discharge status: Home / Self Care | Payer: Medicare HMO | Attending: Emergency Medicine | Admitting: Emergency Medicine

## 2019-03-26 ENCOUNTER — Encounter: Payer: Self-pay | Admitting: Emergency Medicine

## 2019-03-26 DIAGNOSIS — F17210 Nicotine dependence, cigarettes, uncomplicated: Secondary | ICD-10-CM | POA: Insufficient documentation

## 2019-03-26 DIAGNOSIS — I10 Essential (primary) hypertension: Secondary | ICD-10-CM | POA: Insufficient documentation

## 2019-03-26 DIAGNOSIS — G43009 Migraine without aura, not intractable, without status migrainosus: Secondary | ICD-10-CM | POA: Insufficient documentation

## 2019-03-26 DIAGNOSIS — Z79899 Other long term (current) drug therapy: Secondary | ICD-10-CM | POA: Insufficient documentation

## 2019-03-26 DIAGNOSIS — E119 Type 2 diabetes mellitus without complications: Secondary | ICD-10-CM | POA: Diagnosis not present

## 2019-03-26 DIAGNOSIS — R51 Headache: Secondary | ICD-10-CM | POA: Diagnosis present

## 2019-03-26 MED ORDER — PROCHLORPERAZINE EDISYLATE 10 MG/2ML IJ SOLN
10.0000 mg | Freq: Once | INTRAMUSCULAR | Status: AC
  Administered 2019-03-26: 16:00:00 10 mg via INTRAVENOUS
  Filled 2019-03-26: qty 2

## 2019-03-26 MED ORDER — DIPHENHYDRAMINE HCL 50 MG/ML IJ SOLN
25.0000 mg | Freq: Once | INTRAMUSCULAR | Status: AC
  Administered 2019-03-26: 16:00:00 25 mg via INTRAVENOUS
  Filled 2019-03-26: qty 1

## 2019-03-26 MED ORDER — SODIUM CHLORIDE 0.9 % IV BOLUS
1000.0000 mL | Freq: Once | INTRAVENOUS | Status: AC
  Administered 2019-03-26: 1000 mL via INTRAVENOUS

## 2019-03-26 MED ORDER — METHYLPREDNISOLONE SODIUM SUCC 125 MG IJ SOLR
60.0000 mg | Freq: Once | INTRAMUSCULAR | Status: AC
  Administered 2019-03-26: 16:00:00 60 mg via INTRAVENOUS
  Filled 2019-03-26: qty 2

## 2019-03-26 NOTE — ED Provider Notes (Signed)
[facility] Emergency Department Provider Note   ____________________________________________   First MD Initiated Contact with Patient 03/26/19 1510     (approximate)  I have reviewed the triage vital signs and the nursing notes.   HISTORY  Chief Complaint Headache   HPI Leslie Zamora is a 48 y.o. female who complains of almost daily headaches for the last 2 months.  She has been unable to get her massages that usually help with the tension in her neck and upper back since the state went on clot down and she attributes the headaches to this.  She is newly diagnosed diabetic as well.  Is not running a fever.  Says this headache is aware she has had for some time.  Gradual onset lights and loud noises make it worse it sounds like her usual migraine that she was diagnosed with when they started 2 months ago.  She has not had any imaging for these headaches.         Past Medical History:  Diagnosis Date   Diabetes mellitus without complication ([place])    Hypertension    Mitral valve prolapse     There are no active problems to display for this patient.   Past Surgical History:  Procedure Laterality Date   COLONOSCOPY WITH PROPOFOL N/A 10/17/2017   Procedure: COLONOSCOPY WITH PROPOFOL;  Surgeon: [name], MD;  Location[facility] ENDOSCOPY;  Service: Endoscopy;  Laterality: N/A;   LUMBAR FUSION  2005,2008, 2014, 2017   [place]   NASAL SEPTUM SURGERY     TONSILLECTOMY     TUBAL LIGATION  2001    Prior to Admission medications   Medication Sig Start Date End Date Taking? Authorizing Provider  atorvastatin (LIPITOR) 10 MG tablet Take 20 mg by mouth.    [provider]  BIOTIN PO Take 1 capsule by mouth daily.    [provider]  cetirizine (ZYRTEC) 10 MG tablet Take 10 mg by mouth daily.    [provider]  fluticasone (FLONASE) 50 MCG/ACT nasal spray USE 1 SQUIRT IN EACH NOSTRIL DAILY 05/23/17    [provider]  gabapentin (NEURONTIN) 400 MG capsule Take 800 mg by mouth 3 (three) times daily. MAY TAKE TWO ADDITIONAL IF NEEDED 01/23/15   [provider]  hydrochlorothiazide (HYDRODIURIL) 25 MG tablet Take 25 mg by mouth daily. 07/17/17   [provider]  LORazepam (ATIVAN) 0.5 MG tablet Take 0.5 mg by mouth every 8 (eight) hours.    [provider]  medroxyPROGESTERone (PROVERA) 10 MG tablet Take 2 tablets (20 mg total) by mouth daily. 08/10/18   Malachy Mood, MD  Multiple Vitamin (MULTIVITAMIN WITH MINERALS) TABS tablet Take 1 tablet by mouth daily.    [provider]  tiZANidine (ZANAFLEX) 4 MG tablet  08/06/18   [provider]    Allergies Cephalexin; Other; Clindamycin/lincomycin; Gabapentin; Prednisone; and Topiramate  Family History  Adopted: Yes  Problem Relation Age of Onset   Ovarian cancer Sister 25    Social History Social History   Tobacco Use   Smoking status: Current Every Day Smoker    Packs/day: 1.00   Smokeless tobacco: Never Used  Substance Use Topics   Alcohol use: Yes    Comment: occ   Drug use: No    Review of Systems  Constitutional: No fever/chills Eyes: No visual changes. ENT: No sore throat. Cardiovascular: Denies chest pain. Respiratory: Denies shortness of breath. Gastrointestinal: No abdominal pain.  No  nausea, no vomiting.  No diarrhea.  No constipation. Genitourinary: Negative for dysuria. Musculoskeletal: Negative for back pain. Skin: Negative for rash. Neurological: Negative for headaches, focal weakness   ____________________________________________   PHYSICAL EXAM:  VITAL SIGNS: ED Triage Vitals [03/26/19 1350]  Enc Vitals Group     BP 134/67     Pulse Rate 84     Resp 16     Temp 98.3 F (36.8 C)     Temp Source Oral     SpO2 98 %     Weight 236 lb 15.9 oz (107.5 kg)     Height      Head Circumference      Peak Flow      Pain Score 10     Pain Loc       Pain Edu?      Excl. in [place]?     Constitutional: Alert and oriented.  Planing of pain and having cover over her eyes.. Eyes: Conjunctivae are normal. Head: Atraumatic. Nose: No congestion/rhinnorhea. Mouth/Throat: Mucous membranes are moist.  Oropharynx non-erythematous. Neck: No stridor.  Cardiovascular: Normal rate, regular rhythm. Grossly normal heart sounds.  Good peripheral circulation. Respiratory: Normal respiratory effort.  No retractions. Lungs CTAB. Gastrointestinal: Soft and nontender. No distention. No abdominal bruits. . Musculoskeletal: No lower extremity tenderness nor edema.  . Neurologic:  Normal speech and language. No gross focal neurologic deficits are appreciated.  Skin:  Skin is warm, dry and intact. No rash noted.   ____________________________________________   LABS (all labs ordered are listed, but only abnormal results are displayed)  Labs Reviewed - No data to display ____________________________________________  EKG   ____________________________________________  RADIOLOGY  ED MD interpretation CT of the head read by radiology reviewed by me showed no acute disease.  Official radiology report(s): Ct Head Wo Contrast  Result Date: 03/26/2019 CLINICAL DATA:  Headache EXAM: CT HEAD WITHOUT CONTRAST TECHNIQUE: Contiguous axial images were obtained from the base of the skull through the vertex without intravenous contrast. COMPARISON:  January 19, 2016 FINDINGS: Brain: The ventricles are normal in size and configuration. There is a minimal cavum septum pellucidum, an anatomic variant. There is no intracranial mass, hemorrhage, extra-axial fluid collection, or midline shift. There is minimal small vessel disease adjacent to the anterior horns of each lateral ventricle. Elsewhere brain parenchyma appears unremarkable. No acute infarct demonstrable. Vascular: No hyperdense vessel. There is calcification in the left carotid siphon region. Skull: The bony  calvarium appears intact. Sinuses/Orbits: Visualized paranasal sinuses are clear. Orbits appear symmetric bilaterally. Other: Mastoid air cells are clear. IMPRESSION: Minimal periventricular small vessel disease. No acute infarct. No mass or hemorrhage. There is mild arterial vascular calcification. Electronically Signed   By: Lowella Grip III M.D.   On: 03/26/2019 16:28    ____________________________________________   PROCEDURES  Procedure(s) performed (including Critical Care):  Procedures   ____________________________________________   INITIAL IMPRESSION / ASSESSMENT AND PLAN / ED COURSE  ----------------------------------------- 5:17 PM on 03/26/2019 -----------------------------------------  His headache is now gone.  She feels well I will let her go.  Have her follow-up with her regular doctor.  CT of the head was negative.              ____________________________________________   FINAL CLINICAL IMPRESSION(S) / ED DIAGNOSES  Final diagnoses:  Migraine without aura and without status migrainosus, not intractable     ED Discharge Orders    None       Note:  This document was prepared using  [employer] forces training and education officer and may include unintentional dictation errors.    Nena Polio, MD 03/26/19 [phone]

## 2019-03-26 NOTE — Discharge Instructions (Addendum)
Please follow-up with your regular doctor.  Please return as needed if you have return of severe headache.  What I gave you today was Compazine IV with Benadryl and fluids and 1 dose of Solu-Medrol which is a steroid.

## 2019-03-26 NOTE — ED Notes (Signed)
Reports hx of migraine. Took perscribed med with no result. Patient photo sensitive, denies n/v.

## 2019-03-26 NOTE — ED Notes (Signed)
meds given  bous infusing. Patient off the unit to ct head. wlil re-assess upon return.

## 2019-03-26 NOTE — ED Triage Notes (Signed)
C/O headache this am.  Seen through [facility] urgent care, referred to ED for evaluation.  Patient is AAOx3.  Skin warm and dry. NAD

## 2019-08-18 ENCOUNTER — Emergency Department: Payer: Medicare HMO

## 2019-08-18 ENCOUNTER — Emergency Department
Admission: EM | Admit: 2019-08-18 | Discharge: 2019-08-18 | Disposition: A | Discharge status: Home / Self Care | Payer: Medicare HMO | Attending: Emergency Medicine | Admitting: Emergency Medicine

## 2019-08-18 ENCOUNTER — Other Ambulatory Visit: Payer: Self-pay

## 2019-08-18 DIAGNOSIS — R509 Fever, unspecified: Secondary | ICD-10-CM | POA: Diagnosis not present

## 2019-08-18 DIAGNOSIS — R0981 Nasal congestion: Secondary | ICD-10-CM | POA: Insufficient documentation

## 2019-08-18 DIAGNOSIS — I10 Essential (primary) hypertension: Secondary | ICD-10-CM | POA: Diagnosis not present

## 2019-08-18 DIAGNOSIS — R05 Cough: Secondary | ICD-10-CM | POA: Diagnosis present

## 2019-08-18 DIAGNOSIS — E119 Type 2 diabetes mellitus without complications: Secondary | ICD-10-CM | POA: Insufficient documentation

## 2019-08-18 DIAGNOSIS — Z20828 Contact with and (suspected) exposure to other viral communicable diseases: Secondary | ICD-10-CM | POA: Insufficient documentation

## 2019-08-18 DIAGNOSIS — Z20822 Contact with and (suspected) exposure to covid-19: Secondary | ICD-10-CM

## 2019-08-18 DIAGNOSIS — Z79899 Other long term (current) drug therapy: Secondary | ICD-10-CM | POA: Insufficient documentation

## 2019-08-18 DIAGNOSIS — F17200 Nicotine dependence, unspecified, uncomplicated: Secondary | ICD-10-CM | POA: Diagnosis not present

## 2019-08-18 MED ORDER — PREDNISONE 50 MG PO TABS: 50.0000 mg | ORAL_TABLET | Freq: Every day | ORAL | 0 refills | Status: DC

## 2019-08-18 MED ORDER — ONDANSETRON 4 MG PO TBDP: 4.0000 mg | ORAL_TABLET | Freq: Three times a day (TID) | ORAL | 0 refills | Status: DC | PRN

## 2019-08-18 MED ORDER — PSEUDOEPH-BROMPHEN-DM 30-2-10 MG/5ML PO SYRP: 10.0000 mL | ORAL_SOLUTION | Freq: Four times a day (QID) | ORAL | 0 refills | Status: DC | PRN

## 2019-08-18 MED ORDER — ALBUTEROL SULFATE HFA 108 (90 BASE) MCG/ACT IN AERS: 2.0000 | INHALATION_SPRAY | RESPIRATORY_TRACT | 0 refills | Status: AC | PRN

## 2019-08-18 NOTE — ED Notes (Signed)
Pt presents to ED via POV with c/o nausea and diarrhea. Pt states was at [facility] last week and one of the guests snuck out to a bike rally. Pt also c/o cough at this time.

## 2019-08-18 NOTE — ED Provider Notes (Signed)
[facility] Emergency Department Provider Note  ____________________________________________  Time seen: Approximately 5:47 PM  I have reviewed the triage vital signs and the nursing notes.   HISTORY  Chief Complaint Cough, Nasal Congestion, and Fever    HPI Leslie Zamora is a 48 y.o. female who presents the emergency department complaining of nasal congestion, sore throat, cough, mild exertional shortness of breath, nausea, diarrhea.  Patient reports that approximately a week ago she went a bike week time [facility].  She states [place], however when the other party did [place].  Patient reports that she felt fine after returning but  developed symptoms approximately a week after returning from the beach.  Patient reports that all symptoms presented in the first day.  She has not had any fever or chills, no substernal chest pain, difficulty breathing, abdominal pain, hematic emesis or hematochezia.  Patient has history of diabetes and hypertension.  No complaints with chronic medical problems.        Past Medical History:  Diagnosis Date  . Diabetes mellitus without complication ([place])   . Hypertension   . Mitral valve prolapse     There are no active problems to display for this patient.   Past Surgical History:  Procedure Laterality Date  . COLONOSCOPY WITH PROPOFOL N/A 10/17/2017   Procedure: COLONOSCOPY WITH PROPOFOL;  Surgeon: [name], MD;  Location: [facility] ENDOSCOPY;  Service: Endoscopy;  Laterality: N/A;  Hulda Marin, 2014, 2017  . MANDIBLE SURGERY  1989  . NASAL SEPTUM SURGERY    . TONSILLECTOMY    . TUBAL LIGATION  2001    Prior to Admission medications   Medication Sig Start Date End Date Taking? Authorizing Provider  albuterol (VENTOLIN HFA) 108 (90 Base) MCG/ACT inhaler Inhale 2 puffs into the lungs every 4  (four) hours as needed for wheezing or shortness of breath. 08/18/19   Cuthriell, Charline Bills, PA-C  atorvastatin (LIPITOR) 10 MG tablet Take 20 mg by mouth.    [provider]  BIOTIN PO Take 1 capsule by mouth daily.    [provider]  brompheniramine-pseudoephedrine-DM 30-2-10 MG/5ML syrup Take 10 mLs by mouth 4 (four) times daily as needed. 08/18/19   Cuthriell, Charline Bills, PA-C  cetirizine (ZYRTEC) 10 MG tablet Take 10 mg by mouth daily.    [provider]  fluticasone (FLONASE) 50 MCG/ACT nasal spray USE 1 SQUIRT IN EACH NOSTRIL DAILY 05/23/17   [provider]  gabapentin (NEURONTIN) 400 MG capsule Take 800 mg by mouth 3 (three) times daily. MAY TAKE TWO ADDITIONAL IF NEEDED 01/23/15   [provider]  hydrochlorothiazide (HYDRODIURIL) 25 MG tablet Take 25 mg by mouth daily. 07/17/17   [provider]  LORazepam (ATIVAN) 0.5 MG tablet Take 0.5 mg by mouth every 8 (eight) hours.    [provider]  medroxyPROGESTERone (PROVERA) 10 MG tablet Take 2 tablets (20 mg total) by mouth daily. 08/10/18   Malachy Mood, MD  Multiple Vitamin (MULTIVITAMIN WITH MINERALS) TABS tablet Take 1 tablet by mouth daily.    [provider]  ondansetron (ZOFRAN-ODT) 4 MG disintegrating tablet Take 1 tablet (4 mg total) by mouth every 8 (eight) hours as needed for nausea or vomiting. 08/18/19   Cuthriell, Charline Bills, PA-C  predniSONE (DELTASONE) 50 MG tablet Take 1 tablet (50 mg total) by mouth  daily with breakfast. 08/18/19   Cuthriell, Charline Bills, PA-C  tiZANidine (ZANAFLEX) 4 MG tablet  08/06/18   [provider]    Allergies Cephalexin, Other, Clindamycin/lincomycin, Gabapentin, Prednisone, and Topiramate  Family History  Adopted: Yes  Problem Relation Age of Onset  . Ovarian cancer Sister 44    Social History Social History   Tobacco Use  . Smoking status: Current Every Day Smoker    Packs/day: 1.00  . Smokeless  tobacco: Never Used  Substance Use Topics  . Alcohol use: Yes    Comment: occ  . Drug use: No     Review of Systems  Constitutional: No fever/chills Eyes: No visual changes. No discharge ENT: Positive for nasal congestion and sore throat. Cardiovascular: no chest pain. Respiratory: Positive cough.  Mild exertional SOB. Gastrointestinal: No abdominal pain.  Positive for nausea and diarrhea.  No emesis.  No constipation. Genitourinary: Negative for dysuria. No hematuria Musculoskeletal: Negative for musculoskeletal pain. Skin: Negative for rash, abrasions, lacerations, ecchymosis. Neurological: Negative for headaches, focal weakness or numbness. 10-point ROS otherwise negative.  ____________________________________________   PHYSICAL EXAM:  VITAL SIGNS: ED Triage Vitals  Enc Vitals Group     BP 08/18/19 1734 (!) 144/94     Pulse Rate 08/18/19 1734 97     Resp 08/18/19 1734 18     Temp 08/18/19 1734 98.3 F (36.8 C)     Temp Source 08/18/19 1734 Oral     SpO2 08/18/19 1734 98 %     Weight 08/18/19 1735 224 lb (101.6 kg)     Height 08/18/19 1735 5\' 4"  (1.626 m)     Head Circumference --      Peak Flow --      Pain Score 08/18/19 1735 0     Pain Loc --      Pain Edu? --      Excl. in [place]? --      Constitutional: Alert and oriented. Well appearing and in no acute distress. Eyes: Conjunctivae are normal. PERRL. EOMI. Head: Atraumatic. ENT:      Ears: EACs and TMs unremarkable bilaterally.      Nose: Mild clear congestion/rhinnorhea.      Mouth/Throat: Mucous membranes are moist.  Oropharynx is minimally erythematous but nonedematous.  Uvula is midline.  No exudates identified. Neck: No stridor.  Neck is supple full range of motion Hematological/Lymphatic/Immunilogical: No cervical lymphadenopathy. Cardiovascular: Normal rate, regular rhythm. Normal S1 and S2.  Good peripheral circulation. Respiratory: Normal respiratory effort without tachypnea or retractions. Lungs  CTAB. Good air entry to the bases with no decreased or absent breath sounds. Gastrointestinal: Bowel sounds 4 quadrants. Soft and nontender to palpation. No guarding or rigidity. No palpable masses. No distention. No CVA tenderness. Musculoskeletal: Full range of motion to all extremities. No gross deformities appreciated. Neurologic:  Normal speech and language. No gross focal neurologic deficits are appreciated.  Skin:  Skin is warm, dry and intact. No rash noted. Psychiatric: Mood and affect are normal. Speech and behavior are normal. Patient exhibits appropriate insight and judgement.   ____________________________________________   LABS (all labs ordered are listed, but only abnormal results are displayed)  Labs Reviewed  NOVEL CORONAVIRUS, NAA (HOSP ORDER, SEND-OUT TO REF LAB; TAT 18-24 HRS)   ____________________________________________  EKG   ____________________________________________  RADIOLOGY I personally viewed and evaluated these images as part of my medical decision making, as well as reviewing the written report by the radiologist.  Dg Chest 1 View  Result Date: 08/18/2019  CLINICAL DATA:  Cough, exertional dyspnea, sore throat EXAM: CHEST  1 VIEW COMPARISON:  01/18/2016 chest radiograph. FINDINGS: Stable cardiomediastinal silhouette with top-normal heart size. No pneumothorax. No pleural effusion. Cephalization of the pulmonary vasculature without overt pulmonary edema. No acute consolidative airspace disease. IMPRESSION: Cephalization of the pulmonary vasculature without overt pulmonary edema. Top-normal heart size. Otherwise no active cardiopulmonary disease. Electronically Signed   By: Ilona Sorrel M.D.   On: 08/18/2019 18:50    ____________________________________________    PROCEDURES  Procedure(s) performed:    Procedures    Medications - No data to display   ____________________________________________   INITIAL IMPRESSION / ASSESSMENT AND  PLAN / ED COURSE  Pertinent labs & imaging results that were available during my care of the patient were reviewed by me and considered in my medical decision making (see chart for details).  Review of the [place] CSRS was performed in accordance of the [place] prior to dispensing any controlled drugs.        The patient was evaluated for the symptoms described in the history of present illness. The patient was evaluated in the context of the global COVID-19 pandemic, which necessitated consideration that the patient might be at risk for infection with the SARS-CoV-2 virus that causes COVID-19. Institutional protocols and algorithms that pertain to the evaluation of patients at risk for COVID-19 are in a state of rapid change based on information released by regulatory bodies including the CDC and federal and state organizations. The most current policies and algorithms were followed during the patient's care in the ED.   Patient's diagnosis is consistent with suspected given 19 infection.  Patient presented to the emergency department with multiple viral symptoms to include nasal congestion, sore throat, cough, exertional shortness of breath, nausea, diarrhea.  Symptoms have been present x4 days.  At this time chest x-ray is reassuring.  Patient has been tested for COVID-19 but swab does not return.  Patient will be treated with symptom control medications, albuterol and prednisone.  Follow-up primary care as needed..  Patient is given ED precautions to return to the ED for any worsening or new symptoms.     ____________________________________________  FINAL CLINICAL IMPRESSION(S) / ED DIAGNOSES  Final diagnoses:  Suspected COVID-19 virus infection      NEW MEDICATIONS STARTED DURING THIS VISIT:  ED Discharge Orders         Ordered    brompheniramine-pseudoephedrine-DM 30-2-10 MG/5ML syrup  4 times daily PRN     08/18/19 1922    predniSONE (DELTASONE) 50 MG tablet  Daily with breakfast      08/18/19 1922    albuterol (VENTOLIN HFA) 108 (90 Base) MCG/ACT inhaler  Every 4 hours PRN     08/18/19 1922    ondansetron (ZOFRAN-ODT) 4 MG disintegrating tablet  Every 8 hours PRN     08/18/19 1922              This chart was dictated using voice recognition software/Dragon. Despite best efforts to proofread, errors can occur which can change the meaning. Any change was purely unintentional.    Darletta Moll, PA-C 08/18/19 1930    Nena Polio, MD 08/18/19 2127

## 2019-08-18 NOTE — ED Triage Notes (Signed)
Reports cough, congestion and fever for approx 3-4 days. Pt had a recent trip to [facility] [place] 1 week prior to sx onset.  Pt alert and oriented X4, cooperative, RR even and unlabored, color WNL. Pt in NAD.

## 2019-08-20 LAB — NOVEL CORONAVIRUS, NAA (HOSP ORDER, SEND-OUT TO REF LAB; TAT 18-24 HRS): SARS-CoV-2, NAA: NOT DETECTED

## 2019-10-06 ENCOUNTER — Other Ambulatory Visit: Payer: Self-pay | Admitting: Obstetrics and Gynecology

## 2019-10-06 NOTE — Telephone Encounter (Signed)
advise

## 2019-11-09 ENCOUNTER — Other Ambulatory Visit: Payer: Self-pay

## 2019-11-09 ENCOUNTER — Ambulatory Visit
Admission: RE | Admit: 2019-11-09 | Discharge: 2019-11-09 | Disposition: A | Discharge status: Home / Self Care | Payer: Medicare HMO | Source: Ambulatory Visit

## 2019-11-09 ENCOUNTER — Other Ambulatory Visit: Payer: Self-pay | Admitting: Physician Assistant

## 2019-11-09 DIAGNOSIS — Z1231 Encounter for screening mammogram for malignant neoplasm of breast: Secondary | ICD-10-CM

## 2020-01-05 ENCOUNTER — Other Ambulatory Visit: Payer: Self-pay | Admitting: Obstetrics and Gynecology

## 2020-01-05 NOTE — Telephone Encounter (Signed)
Advise

## 2020-03-27 ENCOUNTER — Other Ambulatory Visit: Payer: Self-pay | Admitting: Obstetrics and Gynecology

## 2020-03-27 NOTE — Telephone Encounter (Signed)
Please advise 

## 2020-03-28 ENCOUNTER — Telehealth: Payer: Self-pay | Admitting: Obstetrics and Gynecology

## 2020-03-28 NOTE — Telephone Encounter (Signed)
Called and left voicemail for patient to call back to be schedule for annual exam with Dr. Georgianne Fick.

## 2020-04-26 ENCOUNTER — Encounter: Payer: Self-pay | Admitting: Obstetrics and Gynecology

## 2020-04-26 ENCOUNTER — Other Ambulatory Visit: Payer: Self-pay

## 2020-04-26 ENCOUNTER — Ambulatory Visit (INDEPENDENT_AMBULATORY_CARE_PROVIDER_SITE_OTHER): Payer: Medicare HMO | Admitting: Obstetrics and Gynecology

## 2020-04-26 VITALS — BP 140/90 | Ht 64.5 in | Wt 228.0 lb

## 2020-04-26 DIAGNOSIS — N951 Menopausal and female climacteric states: Secondary | ICD-10-CM | POA: Diagnosis not present

## 2020-04-26 DIAGNOSIS — Z1239 Encounter for other screening for malignant neoplasm of breast: Secondary | ICD-10-CM | POA: Diagnosis not present

## 2020-04-26 DIAGNOSIS — Z01419 Encounter for gynecological examination (general) (routine) without abnormal findings: Secondary | ICD-10-CM

## 2020-04-26 DIAGNOSIS — Z1231 Encounter for screening mammogram for malignant neoplasm of breast: Secondary | ICD-10-CM | POA: Diagnosis not present

## 2020-04-26 MED ORDER — MEDROXYPROGESTERONE ACETATE 10 MG PO TABS: 20.0000 mg | ORAL_TABLET | Freq: Every day | ORAL | 11 refills | Status: DC

## 2020-04-26 NOTE — Progress Notes (Signed)
Gynecology Annual Exam  PCP: Nicholes Rough, PA-C  Chief Complaint:  Chief Complaint  Patient presents with  . Gynecologic Exam    History of Present Illness: Patient is a 49 y.o. [id number] presents for annual exam. The patient has no complaints today.   LMP: No LMP recorded. (Menstrual status: Other). Amenorrhea on po provera 20mg  daily  She currently uses tubal ligation for contraception. She denies dyspareunia.  The patient does perform self breast exams.  There is no notable family history of breast or ovarian cancer in her family.  The patient wears seatbelts: yes.   The patient has regular exercise: not asked.    The patient denies current symptoms of depression.    Review of Systems: ROS  Past Medical History:  There are no problems to display for this patient.   Past Surgical History:  Past Surgical History:  Procedure Laterality Date  . COLONOSCOPY WITH PROPOFOL N/A 10/17/2017   Procedure: COLONOSCOPY WITH PROPOFOL;  Surgeon: [name], MD;  Location: [facility] ENDOSCOPY;  Service: Endoscopy;  Laterality: N/A;  Hulda Marin, 2014, 2017  . MANDIBLE SURGERY  1989  . NASAL SEPTUM SURGERY    . TONSILLECTOMY    . TUBAL LIGATION  2001    Gynecologic History:  No LMP recorded. (Menstrual status: Other). Contraception: tubal ligation Last Pap: Results were:  08/10/2018 no abnormalities  07/01/2016 no abnormalities Endometrial biopsy: 06/04/2016 weakly proliferative endometrium Last mammogram: 11/09/2019 Results were: Gillian Shields I   Obstetric History: [id number]  Family History:  Family History  Adopted: Yes  Problem Relation Age of Onset  . Ovarian cancer Sister 38    Social History:  Social History   Socioeconomic History  . Marital status: Married    Spouse name: Not on file  . Number of children: Not on file  . Years of education: Not on file  . Highest education level: Not on file  Occupational History  . Not on file  Tobacco Use  . Smoking  status: Current Every Day Smoker    Packs/day: 1.00  . Smokeless tobacco: Never Used  Vaping Use  . Vaping Use: Never used  Substance and Sexual Activity  . Alcohol use: Yes    Comment: occ  . Drug use: No  . Sexual activity: Yes    Birth control/protection: Surgical  Other Topics Concern  . Not on file  Social History Narrative  . Not on file   Social Determinants of Health   Financial Resource Strain:   . Difficulty of Paying Living Expenses:   Food Insecurity:   . Worried About Charity fundraiser in the Last Year:   . Arboriculturist in the Last Year:   Transportation Needs:   . Film/video editor (Medical):   Marland Kitchen Lack of Transportation (Non-Medical):   Physical Activity:   . Days of Exercise per Week:   . Minutes of Exercise per Session:   Stress:   . Feeling of Stress :   Social Connections:   . Frequency of Communication with Friends and Family:   . Frequency of Social Gatherings with Friends and Family:   . Attends Religious Services:   . Active Member of Clubs or Organizations:   . Attends Archivist Meetings:   Marland Kitchen Marital Status:   Intimate Partner Violence:   . Fear of Current or Ex-Partner:   . Emotionally Abused:   Marland Kitchen Physically Abused:   . Sexually Abused:     Allergies:  Allergies  Allergen Reactions  . Cephalexin Other (See Comments), Cough, Itching, Rash and Shortness Of Breath  . Other Shortness Of Breath and Swelling    "filler in the gabapentin" currently taking gabapentin from a different drug manufactory "Low reaction"   . Clindamycin/Lincomycin Itching  . Gabapentin   . Prednisone Other (See Comments)    rage  . Topiramate     Medications: Prior to Admission medications   Medication Sig Start Date End Date Taking? Authorizing Provider  albuterol (VENTOLIN HFA) 108 (90 Base) MCG/ACT inhaler Inhale 2 puffs into the lungs every 4 (four) hours as needed for wheezing or shortness of breath. 08/18/19   Cuthriell, Charline Bills, PA-C   atorvastatin (LIPITOR) 10 MG tablet Take 20 mg by mouth.    [provider]  BIOTIN PO Take 1 capsule by mouth daily.    [provider]  brompheniramine-pseudoephedrine-DM 30-2-10 MG/5ML syrup Take 10 mLs by mouth 4 (four) times daily as needed. 08/18/19   Cuthriell, Charline Bills, PA-C  cetirizine (ZYRTEC) 10 MG tablet Take 10 mg by mouth daily.    [provider]  fluticasone (FLONASE) 50 MCG/ACT nasal spray USE 1 SQUIRT IN EACH NOSTRIL DAILY 05/23/17   [provider]  gabapentin (NEURONTIN) 400 MG capsule Take 800 mg by mouth 3 (three) times daily. MAY TAKE TWO ADDITIONAL IF NEEDED 01/23/15   [provider]  hydrochlorothiazide (HYDRODIURIL) 25 MG tablet Take 25 mg by mouth daily. 07/17/17   [provider]  LORazepam (ATIVAN) 0.5 MG tablet Take 0.5 mg by mouth every 8 (eight) hours.    [provider]  medroxyPROGESTERone (PROVERA) 10 MG tablet TAKE 2 TABLETS BY MOUTH EVERY DAY 03/27/20   Malachy Mood, MD  Multiple Vitamin (MULTIVITAMIN WITH MINERALS) TABS tablet Take 1 tablet by mouth daily.    [provider]  ondansetron (ZOFRAN-ODT) 4 MG disintegrating tablet Take 1 tablet (4 mg total) by mouth every 8 (eight) hours as needed for nausea or vomiting. 08/18/19   Cuthriell, Charline Bills, PA-C  predniSONE (DELTASONE) 50 MG tablet Take 1 tablet (50 mg total) by mouth daily with breakfast. 08/18/19   Cuthriell, Charline Bills, PA-C  tiZANidine (ZANAFLEX) 4 MG tablet  08/06/18   [provider]    Physical Exam Vitals: Blood pressure 140/90, height 5' 4.5" (1.638 m), weight 228 lb (103.4 kg).  General: NAD HEENT: normocephalic, anicteric Thyroid: no enlargement, no palpable nodules Pulmonary: No increased work of breathing, CTAB Cardiovascular: RRR, distal pulses 2+ Breast: Breast symmetrical, no tenderness, no palpable nodules or masses, no skin or nipple retraction present, no nipple discharge.  No axillary or  supraclavicular lymphadenopathy. Abdomen: NABS, soft, non-tender, non-distended.  Umbilicus without lesions.  No hepatomegaly, splenomegaly or masses palpable. No evidence of hernia  Genitourinary:  External: Normal external female genitalia.  Normal urethral meatus, normal Bartholin's and Skene's glands.    Vagina: Normal vaginal mucosa, no evidence of prolapse.    Cervix: Grossly normal in appearance, no bleeding  Uterus: Non-enlarged, mobile, normal contour.  No CMT  Adnexa: ovaries non-enlarged, no adnexal masses  Rectal: deferred  Lymphatic: no evidence of inguinal lymphadenopathy Extremities: no edema, erythema, or tenderness Neurologic: Grossly intact Psychiatric: mood appropriate, affect full  Female chaperone present for pelvic and breast  portions of the physical exam    Assessment: 49 y.o. [id number] routine annual exam  Plan: Problem List Items Addressed This Visit    None    Visit Diagnoses    Encounter for gynecological  examination without abnormal finding    -  Primary   Breast screening       Relevant Orders   MM 3D SCREEN BREAST BILATERAL   Menopausal vasomotor syndrome       Relevant Orders   FSH   Estradiol   Breast cancer screening by mammogram          1) Mammogram - recommend yearly screening mammogram.  Mammogram Is up to date  2) STI screening  was notoffered and therefore not obtained  3) ASCCP guidelines and rational discussed.  Patient opts for every 3 years screening interval  4) Contraception - the patient is currently using  tubal ligation.  She is happy with her current form of contraception and plans to continue - on provera for cycle control given HTN - check FSH estradiol to see if appropriate to D/C provera  5) Colonoscopy -- Screening recommended starting at age 57 6) Routine healthcare maintenance including cholesterol, diabetes screening discussed managed by PCP  7) Return in about 1 year (around 04/26/2021) for annual.   Malachy Mood, MD, [place] 04/26/2020, 8:36 AM

## 2020-04-26 NOTE — Patient Instructions (Addendum)
Next mammogram due January 2022  [facility] [address]  [facility]  [address] [place] [address]  Phone: [phone]

## 2020-04-27 LAB — FOLLICLE STIMULATING HORMONE: FSH: 22.3 m[IU]/mL

## 2020-04-27 LAB — ESTRADIOL: Estradiol: 19.9 pg/mL

## 2020-05-07 ENCOUNTER — Other Ambulatory Visit: Payer: Self-pay | Admitting: Obstetrics and Gynecology

## 2020-05-19 ENCOUNTER — Ambulatory Visit (INDEPENDENT_AMBULATORY_CARE_PROVIDER_SITE_OTHER): Payer: Medicare HMO | Admitting: Podiatry

## 2020-05-19 ENCOUNTER — Ambulatory Visit: Payer: Medicare HMO

## 2020-05-19 ENCOUNTER — Other Ambulatory Visit: Payer: Self-pay

## 2020-05-19 DIAGNOSIS — E0843 Diabetes mellitus due to underlying condition with diabetic autonomic (poly)neuropathy: Secondary | ICD-10-CM

## 2020-05-19 DIAGNOSIS — S99922A Unspecified injury of left foot, initial encounter: Secondary | ICD-10-CM | POA: Diagnosis not present

## 2020-05-23 NOTE — Progress Notes (Signed)
   HPI: 49 y.o. female presenting today PMHx diabetes mellitus without complication presents today as a new patient for evaluation of a broken toe to the left third digit.  Patient states approximately a month and a half ago she slipped on a rug and injured her toe.  She went to the urgent care and they diagnosed her with a broken toe.  Today upon presentation she does not have any pain.  There is no pain associated to the toe and it is in good rectus alignment.  She presents for further treatment evaluation  Past Medical History:  Diagnosis Date  . Diabetes mellitus without complication ([place])   . Hypertension   . Mitral valve prolapse      Physical Exam: General: The patient is alert and oriented x3 in no acute distress.  Dermatology: Skin is warm, dry and supple bilateral lower extremities. Negative for open lesions or macerations.  Vascular: Palpable pedal pulses bilaterally. No edema or erythema noted. Capillary refill within normal limits.  Neurological: Epicritic and protective threshold grossly intact bilaterally.   Musculoskeletal Exam: Range of motion within normal limits to all pedal and ankle joints bilateral. Muscle strength 5/5 in all groups bilateral.  The left third toe was in a good rectus alignment.  No pain with palpation.  Radiographic Exam:  Unfortunately x-ray system was down this afternoon and IT was contacted.  We are unable to take radiographic exam however the toe is in good rectus alignment clinically with minimal pain on palpation  Assessment: 1.  Diabetes mellitus type 2 without complication 2.  History of toe fracture 3, 4 left   Plan of Care:  1. Patient evaluated.  Unfortunately we are unable to obtain x-rays this afternoon 2.  Clinically the toe appears to be well.  The patient has no pain in the toe is in a good rectus alignment with no swelling or ecchymosis. 3.  Recommend good foot hygiene and wear good supportive sneakers 4.  Stressed the  importance of controlling blood glucose levels and maintaining her diabetes through her PCP 5.  Return to clinic as needed  *goes by "[facility]"      Edrick Kins, DPM [facility]  Dr. Edrick Kins, DPM    [address], [place] [address]                Office [phone]  Fax [phone]

## 2021-02-26 ENCOUNTER — Other Ambulatory Visit: Payer: Self-pay

## 2021-02-26 ENCOUNTER — Ambulatory Visit: Payer: Medicare HMO | Attending: Otolaryngology

## 2021-02-26 DIAGNOSIS — R2681 Unsteadiness on feet: Secondary | ICD-10-CM | POA: Insufficient documentation

## 2021-02-26 DIAGNOSIS — R262 Difficulty in walking, not elsewhere classified: Secondary | ICD-10-CM | POA: Insufficient documentation

## 2021-02-26 DIAGNOSIS — R42 Dizziness and giddiness: Secondary | ICD-10-CM | POA: Insufficient documentation
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
## 2021-03-05 ENCOUNTER — Encounter: Payer: Self-pay | Admitting: Physical Therapy

## 2021-03-05 ENCOUNTER — Ambulatory Visit: Payer: Medicare HMO | Attending: Otolaryngology | Admitting: Physical Therapy

## 2021-03-05 ENCOUNTER — Other Ambulatory Visit: Payer: Self-pay

## 2021-03-05 DIAGNOSIS — R42 Dizziness and giddiness: Secondary | ICD-10-CM | POA: Insufficient documentation

## 2021-03-05 DIAGNOSIS — R2681 Unsteadiness on feet: Secondary | ICD-10-CM | POA: Diagnosis present

## 2021-03-05 DIAGNOSIS — R262 Difficulty in walking, not elsewhere classified: Secondary | ICD-10-CM | POA: Diagnosis present

## 2021-03-05 NOTE — Therapy (Signed)
[facility] [address] [place], [place], [address] Phone: [phone]   Fax:  [phone]  Physical Therapy Treatment  Patient Details  Name: Leslie Zamora MRN: [id number] Date of Birth: [birth year] Referring Provider (PT): Leta Baptist, MD   Encounter Date: 03/05/2021   PT End of Session - 03/05/21 2025    Visit Number 2    Number of Visits 9    Date for PT Re-Evaluation 04/27/21   POC for 8 weeks, Cert for 60 days   Authorization Type [employer] (10 Visit PN)    Progress Note Due on Visit 10    PT Start Time 1105    PT Stop Time 1150    PT Time Calculation (min) 45 min    Activity Tolerance Patient tolerated treatment well    Behavior During Therapy [facility] for tasks assessed/performed           Past Medical History:  Diagnosis Date  . Diabetes mellitus without complication ([place])   . Hypertension   . Mitral valve prolapse     Past Surgical History:  Procedure Laterality Date  . COLONOSCOPY WITH PROPOFOL N/A 10/17/2017   Procedure: COLONOSCOPY WITH PROPOFOL;  Surgeon: [name], MD;  Location: [facility] ENDOSCOPY;  Service: Endoscopy;  Laterality: N/A;  Hulda Marin, 2014, 2017  . MANDIBLE SURGERY  1989  . NASAL SEPTUM SURGERY    . TONSILLECTOMY    . TUBAL LIGATION  2001    There were no vitals filed for this visit.   Subjective Assessment - 03/05/21 2013    Subjective Pt reports no changes since eval last week - continues to have dizziness and tinnitus    Pertinent History Mitral Valve Prolapse, HTN, DM; Back Surgeries;    Limitations Walking    How long can you stand comfortably? 10-15    Currently in Pain? Yes    Pain Score 3     Pain Location Back    Pain Orientation Lower    Pain Descriptors / Indicators Aching;Discomfort    Pain Type Chronic pain    Pain Onset More than a month ago    Pain Frequency Constant                   Vestibular Assessment - 03/05/21 0001       Positional Sensitivities   Sit to Supine No dizziness    Supine to Left Side No dizziness    Supine to Right Side Lightheadedness    Supine to Sitting Lightheadedness    Nose to Right Knee No dizziness    Right Knee to Sitting Lightedness    Nose to Left Knee No dizziness    Left Knee to Sitting Mild dizziness    Head Turning x 5 Moderate dizziness    Head Nodding x 5 Moderate dizziness    Pivot Right in Standing Lightheadedness    Pivot Left in Standing No dizziness   "funny feeling"   Rolling Right No dizziness    Rolling Left No dizziness    Positional Sensitivities Comments sit to stand = 2                     Vestibular Treatment/Exercise - 03/05/21 0001      Vestibular Treatment/Exercise   Vestibular Treatment Provided Gaze    Gaze Exercises X1 Viewing Horizontal;X1 Viewing Vertical      X1 Viewing Horizontal   Foot Position bil. stance - standing -  had chair in front    Time --   30 secs; pt did not hold onto chair until after 15 secs   Reps 1      X1 Viewing Vertical   Foot Position bil. stance - standing - chair in front    Time --   30 secs - pt held chair after approx. 5 secs due to c/o dizziness   Reps 1              Balance Exercises - 03/05/21 0001      Balance Exercises: Standing   Standing Eyes Opened Narrow base of support (BOS);Wide (BOA);Head turns;Foam/compliant surface;5 reps   horizontal and vertical head turns 5 reps each   Standing Eyes Closed Narrow base of support (BOS);Wide (BOA);Head turns;Foam/compliant surface;5 reps   horizontal & vertical head turns            PT Education - 03/05/21 2023    Education Details Pt was given HEP - x1 viewing in standing - horizontal and vertical;  balance on foam (4 positions)    Person(s) Educated Patient    Methods Explanation;Demonstration;Handout    Comprehension Verbalized understanding;Returned demonstration            PT Short Term Goals - 03/05/21 2036      PT SHORT  TERM GOAL #1   Title Patient will be independent with initial Vestibular/Balance HEP (all STGs Due: 03/26/21)    Baseline no HEP established    Time 4    Period Weeks    Status New    Target Date 03/26/21      PT SHORT TERM GOAL #2   Title Patient will undergo further assessment (FGA/MSQ) and LTG to be set as appropriate    Baseline TBA    Time 4    Period Weeks    Status New             PT Long Term Goals - 03/05/21 2036      PT LONG TERM GOAL #1   Title Patient will be independent with final Vestibular/Balance HEP (all LTGs Due: 04/27/21)    Baseline no HEP established    Time 8    Period Weeks    Status New      PT LONG TERM GOAL #2   Title Patient will demo ability to hold situaiton 4 of M-CTSIB for >/= 20 seconds to demo improved vestibular input and balance    Baseline 6 secs    Time 8    Period Weeks    Status New      PT LONG TERM GOAL #3   Title Patient will demonstrate </= 2 line difference on DVA to demonstrate improved VOR    Baseline 3 line difference    Time 8    Period Weeks    Status New      PT LONG TERM GOAL #4   Title Patient will improve DFS >/= 52 and DPS >/= 55    Baseline DPS 50: DFS: 47.6    Time 8    Period Weeks    Status New      PT LONG TERM GOAL #5   Title LTG to bet set for FGA    Baseline TBA    Time 8    Period Weeks    Status New                 Plan - 03/05/21 1110    Clinical Impression Statement Skilled  PT session focused on MSQ assessment with pt reporting lightheadedness with return to upright position but no dizziness reported with rolling to Rt or Lt sides.  Pt also reported dizziness with turning to Rt and Lt sides in standing.  Session also focused on HEP instruction including x1 viewing, in which pt was able to perform this exercise for 30 secs prior to needing to stop ex. due to increased dizziness provoked.  Pt also performed balance on pillows to facilitate increased vestibular input - pt able to perform  standing with feet together with EC with moderate sway, but did not have complete LOB with this exercise.  Cont with POC.    Personal Factors and Comorbidities Time since onset of injury/illness/exacerbation;Comorbidity 3+    Comorbidities Mitral Valve Prolapse, HTN, DM, Prior Hx of Back Surgeries    Examination-Activity Limitations Bend;Stand;Locomotion Level    Examination-Participation Restrictions Driving;Community Activity;Yard Work;Cleaning;Laundry;Meal Prep    Stability/Clinical Decision Making Stable/Uncomplicated    Rehab Potential Good    PT Frequency 1x / week    PT Duration 8 weeks    PT Treatment/Interventions ADLs/Self Care Home Management;Canalith Repostioning;Cryotherapy;Moist Heat;Gait training;Stair training;Functional mobility training;Therapeutic activities;Therapeutic exercise;Balance training;Neuromuscular re-education;Patient/family education;Manual techniques;Passive range of motion;Vestibular;Dry needling    PT Next Visit Plan Assess FGA; (MSQ completed 03-05-21); check HEP issued on 03-05-21 - x1 viewing in standing and balance on foam    Consulted and Agree with Plan of Care Patient           Patient will benefit from skilled therapeutic intervention in order to improve the following deficits and impairments:  Decreased balance,Decreased activity tolerance,Difficulty walking,Dizziness,Impaired sensation,Pain  Visit Diagnosis: Dizziness and giddiness  Unsteadiness on feet     Problem List There are no problems to display for this patient.   Alda Lea, PT 03/05/2021, 8:37 PM  [facility] [address] [place], [place], [address] Phone: [phone]   Fax:  [phone]  Name: Leslie Zamora MRN: [id number] Date of Birth: [birth year]

## 2021-03-05 NOTE — Patient Instructions (Addendum)
BALANCE ON FOAM -              FEET APART - EO;  THEN EC  (4 POSITIONS)                         FEET TOGETHER - EO AND THEN WITH EC     Feet Together (Compliant Surface) Varied Arm Positions - Eyes Open; do with feet apart and then feet together - ARMS BY YOUR SIDE    With eyes open, standing on compliant surface: _pillows_______, feet together and arms out, look at a stationary object. Hold __30__ seconds. Repeat __1_ times per session. Do _1___ sessions per day.     Feet Apart (Compliant Surface) Head Motion - Eyes Closed - add head turns side to side and up/down;  Also do exercise with feet together - add head turns side to side and up/down    Stand on compliant surface: __pillows______ with feet shoulder width apart. Close eyes and move head slowly, up and down. Repeat __1__ times per session. Do ___1-2_ sessions per day.   Gaze Stabilization: Tip Card  1.Target must remain in focus, not blurry, and appear stationary while head is in motion. 2.Perform exercises with small head movements (45 to either side of midline). 3.Increase speed of head motion so long as target is in focus. 4.If you wear eyeglasses, be sure you can see target through lens (therapist will give specific instructions for bifocal / progressive lenses). 5.These exercises may provoke dizziness or nausea. Work through these symptoms. If too dizzy, slow head movement slightly. Rest between each exercise. 6.Exercises demand concentration; avoid distractions. 7.For safety, perform standing exercises close to a counter, wall, corner, or next to someone.    Gaze Stabilization: Standing Feet Apart    Feet shoulder width apart, keeping eyes on target on wall __6__ feet away, tilt head down 15-30 and move head side to side for _60___ seconds. Repeat while moving head up and down for _60___ seconds. Do _3-5___ sessions per day. Repeat using target on pattern background.

## 2021-03-12 ENCOUNTER — Other Ambulatory Visit: Payer: Self-pay

## 2021-03-12 ENCOUNTER — Ambulatory Visit: Payer: Medicare HMO

## 2021-03-12 DIAGNOSIS — R42 Dizziness and giddiness: Secondary | ICD-10-CM

## 2021-03-12 DIAGNOSIS — R2681 Unsteadiness on feet: Secondary | ICD-10-CM

## 2021-03-12 NOTE — Therapy (Signed)
[facility] [address] [place], [place], [address] Phone: [phone]   Fax:  [phone]  Physical Therapy Treatment  Patient Details  Name: Leslie Zamora MRN: [id number] Date of Birth: 1971/04/01 Referring Provider (PT): Newman Pies, MD   Encounter Date: 03/12/2021   PT End of Session - 03/12/21 1056    Visit Number 3    Number of Visits 9    Date for PT Re-Evaluation 04/27/21   POC for 8 weeks, Cert for 60 days   Authorization Type [employer] (10 Visit PN)    Progress Note Due on Visit 10    PT Start Time 1056    PT Stop Time 1141    PT Time Calculation (min) 45 min    Activity Tolerance Patient tolerated treatment well    Behavior During Therapy [facility] for tasks assessed/performed           Past Medical History:  Diagnosis Date  . Diabetes mellitus without complication (HCC)   . Hypertension   . Mitral valve prolapse     Past Surgical History:  Procedure Laterality Date  . COLONOSCOPY WITH PROPOFOL N/A 10/17/2017   Procedure: COLONOSCOPY WITH PROPOFOL;  Surgeon: Wyline Mood, MD;  Location: [facility] ENDOSCOPY;  Service: Endoscopy;  Laterality: N/A;  Marilynn Rail, 2014, 2017  . MANDIBLE SURGERY  1989  . NASAL SEPTUM SURGERY    . TONSILLECTOMY    . TUBAL LIGATION  2001    There were no vitals filed for this visit.   Subjective Assessment - 03/12/21 1058    Subjective Patient denies changes. Reports that she had a very dizzy weekend, reports believes may be due to weather. No dizziness currently, but does feel off balanced. Patient reports exericses are going well, has not been able to complete them regularly.    Pertinent History Mitral Valve Prolapse, HTN, DM; Back Surgeries;    Limitations Walking    How long can you stand comfortably? 10-15    Currently in Pain? Yes    Pain Score 3     Pain Location Back    Pain Orientation Lower    Pain Descriptors / Indicators Aching    Pain  Type Chronic pain    Pain Onset More than a month ago              [facility] PT Assessment - 03/12/21 0001      Functional Gait  Assessment   Gait assessed  Yes    Gait Level Surface Walks 20 ft in less than 5.5 sec, no assistive devices, good speed, no evidence for imbalance, normal gait pattern, deviates no more than 6 in outside of the 12 in walkway width.    Change in Gait Speed Able to smoothly change walking speed without loss of balance or gait deviation. Deviate no more than 6 in outside of the 12 in walkway width.   increased dizziness with transition from fast > slow   Gait with Horizontal Head Turns Performs head turns smoothly with slight change in gait velocity (eg, minor disruption to smooth gait path), deviates 6-10 in outside 12 in walkway width, or uses an assistive device.    Gait with Vertical Head Turns Performs task with slight change in gait velocity (eg, minor disruption to smooth gait path), deviates 6 - 10 in outside 12 in walkway width or uses assistive device   increased dizziness 6/10.   Gait and Pivot Turn Pivot turns safely within  3 sec and stops quickly with no loss of balance.   increased symptoms with full body turn   Step Over Obstacle Is able to step over one shoe box (4.5 in total height) without changing gait speed. No evidence of imbalance.    Gait with Narrow Base of Support Ambulates 7-9 steps.    Gait with Eyes Closed Walks 20 ft, uses assistive device, slower speed, mild gait deviations, deviates 6-10 in outside 12 in walkway width. Ambulates 20 ft in less than 9 sec but greater than 7 sec.    Ambulating Backwards Walks 20 ft, uses assistive device, slower speed, mild gait deviations, deviates 6-10 in outside 12 in walkway width.    Steps Alternating feet, must use rail.    Total Score 23    FGA comment: 23/30. Increased dizziness at end of completion 4/10.              [facility] PT Treatment/Exercise - 03/12/21 0001      Ambulation/Gait    Ambulation/Gait Yes    Ambulation/Gait Assistance 5: Supervision    Ambulation/Gait Assistance Details throughout therapy gym activities    Assistive device None    Gait Pattern Step-through pattern;Wide base of support    Ambulation Surface Level;Indoor           Vestibular Treatment/Exercise - 03/12/21 0001      Vestibular Treatment/Exercise   Vestibular Treatment Provided Gaze;Habituation    Habituation Exercises Standing Horizontal Head Turns;Standing Vertical Head Turns    Gaze Exercises X1 Viewing Horizontal;X1 Viewing Vertical      Standing Horizontal Head Turns   Number of Reps  5    Symptom Description  x 2 sets of horizontal head turns to target on wall. Intermittent standing rest breaks required due to symptoms, patient leaning into wall for support.      Standing Vertical Head Turns   Number of Reps  5    Symptom Description  x 2 sets of vertical head turns to target on floor/ceiling. Intermittent standing rest breaks required due to symptoms, patient leaning into wall for support.      X1 Viewing Horizontal   Foot Position bil. stance - standing    Reps 3    Comments x 15-20 seconds, seated rest break required after completion. Moderate dizziness, mild postural sway      X1 Viewing Vertical   Foot Position bil. stance - standing    Reps 2    Comments x 15-20 seconds, seated rest break required after completion. increased dizziness with vertical > horizontal, require seated rest break after each rep. Moderate dizziness & mild postural sway              Balance Exercises - 03/12/21 0001      Balance Exercises: Standing   Standing Eyes Closed Wide (BOA);Foam/compliant surface;3 reps;30 secs;Limitations    Standing Eyes Closed Limitations standing wide BOS on airex, eyes closed 3 x 20-30 seconds. increased postural sway noted.             PT Education - 03/12/21 1056    Education Details FGA Results    Person(s) Educated Patient    Methods Explanation     Comprehension Verbalized understanding            PT Short Term Goals - 03/12/21 1155      PT SHORT TERM GOAL #1   Title Patient will be independent with initial Vestibular/Balance HEP (all STGs Due: 03/26/21)    Baseline no  HEP established    Time 4    Period Weeks    Status New    Target Date 03/26/21      PT SHORT TERM GOAL #2   Title Patient will undergo further assessment (FGA/MSQ) and LTG to be set as appropriate    Baseline assessed and LTG updated    Time 4    Period Weeks    Status Achieved             PT Long Term Goals - 03/12/21 1156      PT LONG TERM GOAL #1   Title Patient will be independent with final Vestibular/Balance HEP (all LTGs Due: 04/27/21)    Baseline no HEP established    Time 8    Period Weeks    Status New      PT LONG TERM GOAL #2   Title Patient will demo ability to hold situaiton 4 of M-CTSIB for >/= 20 seconds to demo improved vestibular input and balance    Baseline 6 secs    Time 8    Period Weeks    Status New      PT LONG TERM GOAL #3   Title Patient will demonstrate </= 2 line difference on DVA to demonstrate improved VOR    Baseline 3 line difference    Time 8    Period Weeks    Status New      PT LONG TERM GOAL #4   Title Patient will improve DFS >/= 52 and DPS >/= 55    Baseline DPS 50: DFS: 47.6    Time 8    Period Weeks    Status New      PT LONG TERM GOAL #5   Title Patient will improve FGA to >/= 26/30 to demo improved balance and reduced fall risk    Baseline 23/30    Time 8    Period Weeks    Status New                 Plan - 03/12/21 1057    Clinical Impression Statement Today's skilled PT session included further balance assessment with FGA, patient scoring 23/30. Increased challenge with gait with head turns/nods, and vision removed. Significant increase in dizziness with FGA requiring seated rest breaks Continued habituation and VOR progression to patient's tolerance. Continue to be  symptomatic with vertical > horiz. Will continue to progress toward all LTGs.    Personal Factors and Comorbidities Time since onset of injury/illness/exacerbation;Comorbidity 3+    Comorbidities Mitral Valve Prolapse, HTN, DM, Prior Hx of Back Surgeries    Examination-Activity Limitations Bend;Stand;Locomotion Level    Examination-Participation Restrictions Driving;Community Activity;Yard Work;Cleaning;Laundry;Meal Prep    Stability/Clinical Decision Making Stable/Uncomplicated    Rehab Potential Good    PT Frequency 1x / week    PT Duration 8 weeks    PT Treatment/Interventions ADLs/Self Care Home Management;Canalith Repostioning;Cryotherapy;Moist Heat;Gait training;Stair training;Functional mobility training;Therapeutic activities;Therapeutic exercise;Balance training;Neuromuscular re-education;Patient/family education;Manual techniques;Passive range of motion;Vestibular;Dry needling    PT Next Visit Plan continue habituation (bending down/reaching), x1 viewing in standing, foam balance    Consulted and Agree with Plan of Care Patient           Patient will benefit from skilled therapeutic intervention in order to improve the following deficits and impairments:  Decreased balance,Decreased activity tolerance,Difficulty walking,Dizziness,Impaired sensation,Pain  Visit Diagnosis: Dizziness and giddiness  Unsteadiness on feet     Problem List There are no problems to display for this patient.  Jones Bales, PT, DPT 03/12/2021, 11:57 AM  [facility] [address] [place], [place], [address] Phone: [phone]   Fax:  [phone]  Name: Leslie Zamora MRN: [id number] Date of Birth: April 08, 1971

## 2021-03-13 ENCOUNTER — Telehealth: Payer: Self-pay

## 2021-03-13 ENCOUNTER — Other Ambulatory Visit: Payer: Self-pay | Admitting: Orthopaedic Surgery

## 2021-03-13 DIAGNOSIS — M5416 Radiculopathy, lumbar region: Secondary | ICD-10-CM

## 2021-03-13 NOTE — Progress Notes (Signed)
Phone call to patient to verify medication list and allergies for myelogram procedure. Pt instructed to hold Elavil, Cymbalta, and Maxalt for 48hrs prior to myelogram appointment time and 24 hours after appointment. Pt also instructed to have a driver the day of the procedure, the procedure would take around 2 hours, and discharge instructions discussed. Pt verbalized understanding.

## 2021-03-19 ENCOUNTER — Ambulatory Visit: Payer: Medicare HMO

## 2021-03-19 ENCOUNTER — Other Ambulatory Visit: Payer: Self-pay

## 2021-03-19 DIAGNOSIS — R2681 Unsteadiness on feet: Secondary | ICD-10-CM

## 2021-03-19 DIAGNOSIS — R42 Dizziness and giddiness: Secondary | ICD-10-CM

## 2021-03-19 NOTE — Therapy (Signed)
[facility] [address] [place], [place], [address] Phone: [phone]   Fax:  [phone]  Physical Therapy Treatment  Patient Details  Name: Leslie Zamora MRN: [id number] Date of Birth: 07-31-1971 Referring Provider (PT): Leta Baptist, MD   Encounter Date: 03/19/2021   PT End of Session - 03/19/21 1059    Visit Number 4    Number of Visits 9    Date for PT Re-Evaluation 04/27/21   POC for 8 weeks, Cert for 60 days   Authorization Type [employer] (10 Visit PN)    Progress Note Due on Visit 10    PT Start Time 1100    PT Stop Time 1143    PT Time Calculation (min) 43 min    Activity Tolerance Patient tolerated treatment well    Behavior During Therapy [facility] for tasks assessed/performed           Past Medical History:  Diagnosis Date  . Diabetes mellitus without complication ([place])   . Hypertension   . Mitral valve prolapse     Past Surgical History:  Procedure Laterality Date  . COLONOSCOPY WITH PROPOFOL N/A 10/17/2017   Procedure: COLONOSCOPY WITH PROPOFOL;  Surgeon: [name], MD;  Location: [facility] ENDOSCOPY;  Service: Endoscopy;  Laterality: N/A;  Hulda Marin, 2014, 2017  . MANDIBLE SURGERY  1989  . NASAL SEPTUM SURGERY    . TONSILLECTOMY    . TUBAL LIGATION  2001    There were no vitals filed for this visit.   Subjective Assessment - 03/19/21 1100    Subjective Patient reports that she had a very dizzy day saturday, potential due to the weather. Reports exercises are going okay. Reports she got up to 22 seconds on the gaze stabilization.    Pertinent History Mitral Valve Prolapse, HTN, DM; Back Surgeries;    Limitations Walking    How long can you stand comfortably? 10-15    Currently in Pain? Yes    Pain Score 4     Pain Location Head    Pain Orientation Left    Pain Descriptors / Indicators Headache    Pain Type Acute pain    Pain Onset Today             [facility] Adult  PT Treatment/Exercise - 03/19/21 0001      Ambulation/Gait   Ambulation/Gait Yes    Ambulation/Gait Assistance 5: Supervision    Ambulation/Gait Assistance Details throughout therapy gym with activities    Ambulation Distance (Feet) --   clinic distances   Assistive device None    Gait Pattern Step-through pattern;Wide base of support    Ambulation Surface Level;Indoor      Therapeutic Activites    Therapeutic Activities Other Therapeutic Activities    Other Therapeutic Activities Completed bending down and reaching to cones from seated position, completed 2 x 5 reps, intermittent rest break required due to dizziness. Progressed to standing with cone to floor patient unable to complete due to severe dizziness, require Min A to stand back up from squat position. Seated rest break required before completing next set. With next set, PT sat cones on 6" step completed 2 sets x 2 cones each. Completed pure vertical tracking with small ball (use of LLE only with movement due to R shoulder pain), completed 2 x 4 reps. Patient has one instance during tracking of 10/10 dizziness. require standing/seated rest breaks intermittently.      Neuro Re-ed  Neuro Re-ed Details  Completed marching on upward incline with eyes open to steady pace, then with addition of horizontal head turns x 5 reps, and vertical head turns x 5 reps. Moderate dizziness and postural sway noted with vertical > horizontal.           Vestibular Treatment/Exercise - 03/19/21 0001      Vestibular Treatment/Exercise   Vestibular Treatment Provided Gaze    Gaze Exercises X1 Viewing Horizontal;X1 Viewing Vertical      X1 Viewing Horizontal   Foot Position bil. stance - standing    Reps 2    Comments x 30 seconds; mild dizziness      X1 Viewing Vertical   Foot Position bil. stance - standing    Reps 2    Comments x 30 seconds;  moderate dizziness               PT Short Term Goals - 03/12/21 1155      PT SHORT TERM  GOAL #1   Title Patient will be independent with initial Vestibular/Balance HEP (all STGs Due: 03/26/21)    Baseline no HEP established    Time 4    Period Weeks    Status New    Target Date 03/26/21      PT SHORT TERM GOAL #2   Title Patient will undergo further assessment (FGA/MSQ) and LTG to be set as appropriate    Baseline assessed and LTG updated    Time 4    Period Weeks    Status Achieved             PT Long Term Goals - 03/12/21 1156      PT LONG TERM GOAL #1   Title Patient will be independent with final Vestibular/Balance HEP (all LTGs Due: 04/27/21)    Baseline no HEP established    Time 8    Period Weeks    Status New      PT LONG TERM GOAL #2   Title Patient will demo ability to hold situaiton 4 of M-CTSIB for >/= 20 seconds to demo improved vestibular input and balance    Baseline 6 secs    Time 8    Period Weeks    Status New      PT LONG TERM GOAL #3   Title Patient will demonstrate </= 2 line difference on DVA to demonstrate improved VOR    Baseline 3 line difference    Time 8    Period Weeks    Status New      PT LONG TERM GOAL #4   Title Patient will improve DFS >/= 52 and DPS >/= 55    Baseline DPS 50: DFS: 47.6    Time 8    Period Weeks    Status New      PT LONG TERM GOAL #5   Title Patient will improve FGA to >/= 26/30 to demo improved balance and reduced fall risk    Baseline 23/30    Time 8    Period Weeks    Status New                 Plan - 03/19/21 1159    Clinical Impression Statement Today's skilled PT session included continued progression of VOR, able to progress to 30 seconds today. Completed habituation working on bending forward, increased dizziness require intermittent rest breaks. Min A once to come up from squat position due severe dizziness once required. Will continue to progress  toward all LTGs.    Personal Factors and Comorbidities Time since onset of injury/illness/exacerbation;Comorbidity 3+     Comorbidities Mitral Valve Prolapse, HTN, DM, Prior Hx of Back Surgeries    Examination-Activity Limitations Bend;Stand;Locomotion Level    Examination-Participation Restrictions Driving;Community Activity;Yard Work;Cleaning;Laundry;Meal Prep    Stability/Clinical Decision Making Stable/Uncomplicated    Rehab Potential Good    PT Frequency 1x / week    PT Duration 8 weeks    PT Treatment/Interventions ADLs/Self Care Home Management;Canalith Repostioning;Cryotherapy;Moist Heat;Gait training;Stair training;Functional mobility training;Therapeutic activities;Therapeutic exercise;Balance training;Neuromuscular re-education;Patient/family education;Manual techniques;Passive range of motion;Vestibular;Dry needling    PT Next Visit Plan check STGs. continue habituation (bending down/reaching), x1 viewing in standing, foam balance.    Consulted and Agree with Plan of Care Patient           Patient will benefit from skilled therapeutic intervention in order to improve the following deficits and impairments:  Decreased balance,Decreased activity tolerance,Difficulty walking,Dizziness,Impaired sensation,Pain  Visit Diagnosis: Dizziness and giddiness  Unsteadiness on feet     Problem List There are no problems to display for this patient.   Jones Bales, PT, DPT 03/19/2021, 12:13 PM  [facility] [address] [place], [place], [address] Phone: [phone]   Fax:  [phone]  Name: Taya Ashbaugh MRN: [id number] Date of Birth: [birth year]

## 2021-03-20 ENCOUNTER — Ambulatory Visit
Admission: RE | Admit: 2021-03-20 | Discharge: 2021-03-20 | Disposition: A | Discharge status: Home / Self Care | Payer: Medicare HMO | Source: Ambulatory Visit | Attending: Orthopaedic Surgery | Admitting: Orthopaedic Surgery

## 2021-03-20 DIAGNOSIS — M5416 Radiculopathy, lumbar region: Secondary | ICD-10-CM

## 2021-03-20 MED ORDER — DIAZEPAM 5 MG PO TABS
10.0000 mg | ORAL_TABLET | Freq: Once | ORAL | Status: AC
  Administered 2021-03-20: 10 mg via ORAL

## 2021-03-20 MED ORDER — IOPAMIDOL (ISOVUE-M 200) INJECTION 41%
20.0000 mL | Freq: Once | INTRAMUSCULAR | Status: AC
  Administered 2021-03-20: 20 mL via INTRATHECAL

## 2021-03-20 NOTE — Discharge Instructions (Signed)
Myelogram Discharge Instructions  1. Go home and rest quietly as needed. You may resume normal activities; however, do not exert yourself strongly or do any heavy lifting today and tomorrow.   2. DO NOT drive today.    3. You may resume your normal diet and medications unless otherwise indicated. Drink lots of extra fluids today and tomorrow.   4. The incidence of headache, nausea, or vomiting is about 5% (one in 20 patients).  If you develop a headache, lie flat for 24 hours and drink plenty of fluids until the headache goes away.  Caffeinated beverages may be helpful. If when you get up you still have a headache when standing, go back to bed and force fluids for another 24 hours.   5. If you develop severe nausea and vomiting or a headache that does not go away with the flat bedrest after 48 hours, please call [phone].   6. Call your physician for a follow-up appointment.  The results of your myelogram will be sent directly to your physician by the following day.  7. If you have any questions or if complications develop after you arrive home, please call [phone].  Discharge instructions have been explained to the patient.  The patient, or the person responsible for the patient, fully understands these instructions.   Thank you for visiting our office today.   YOU MAY RESUME YOUR ELAVIL, CYMBALTA, AND MAXALT TOMORROW 03/21/21 AT 1:00 PM

## 2021-03-20 NOTE — Progress Notes (Signed)
Pt reports she has been off of her Elavil, Cymbalta, and Maxalt for at least 48 hours.

## 2021-03-26 ENCOUNTER — Ambulatory Visit: Payer: Medicare HMO

## 2021-03-26 ENCOUNTER — Other Ambulatory Visit: Payer: Self-pay

## 2021-03-26 DIAGNOSIS — R42 Dizziness and giddiness: Secondary | ICD-10-CM

## 2021-03-26 DIAGNOSIS — R2681 Unsteadiness on feet: Secondary | ICD-10-CM

## 2021-03-26 DIAGNOSIS — R262 Difficulty in walking, not elsewhere classified: Secondary | ICD-10-CM

## 2021-03-26 NOTE — Therapy (Signed)
[facility] [address] [place], [place], [address] Phone: [phone]   Fax:  [phone]  Physical Therapy Treatment  Patient Details  Name: Leslie Zamora MRN: [id number] Date of Birth: 1971/08/28 Referring Provider (PT): Leta Baptist, MD   Encounter Date: 03/26/2021   PT End of Session - 03/26/21 1409    Visit Number 5    Number of Visits 9    Date for PT Re-Evaluation 04/27/21   POC for 8 weeks, Cert for 60 days   Authorization Type [employer] (10 Visit PN)    Progress Note Due on Visit 10    PT Start Time 1402    PT Stop Time 1445    PT Time Calculation (min) 43 min    Equipment Utilized During Treatment Gait belt    Activity Tolerance Patient tolerated treatment well    Behavior During Therapy WFL for tasks assessed/performed           Past Medical History:  Diagnosis Date  . Diabetes mellitus without complication ([place])   . Hypertension   . Mitral valve prolapse     Past Surgical History:  Procedure Laterality Date  . COLONOSCOPY WITH PROPOFOL N/A 10/17/2017   Procedure: COLONOSCOPY WITH PROPOFOL;  Surgeon: [name], MD;  Location: [facility] ENDOSCOPY;  Service: Endoscopy;  Laterality: N/A;  [facility], 2014, 2017  . MANDIBLE SURGERY  1989  . NASAL SEPTUM SURGERY    . TONSILLECTOMY    . TUBAL LIGATION  2001    There were no vitals filed for this visit.   Subjective Assessment - 03/26/21 1405    Subjective Patient reports she is having a very dizzy day. Patient reports that this morning she went to stand and got super dizzy, causing her to fall back into the chair. Reports [place] fair over the [place], had difficulty ambulating across uneven pavement. Had a few stumbles, husband helped her maintain the balance.    Pertinent History Mitral Valve Prolapse, HTN, DM; Back Surgeries;    Limitations Walking    How long can you stand comfortably? 10-15    Currently in Pain? Yes     Pain Score 3     Pain Location Head    Pain Orientation Left   behind the ear   Pain Descriptors / Indicators Headache    Pain Type Acute pain    Pain Onset Today                   Vestibular Assessment - 03/26/21 0001      Orthostatics   BP sitting 105/67    HR sitting 94    BP standing (after 1 minute) 102/59    HR standing (after 1 minute) 84    BP standing (after 3 minutes) 94/67    HR standing (after 3 minutes) 109    Orthostatics Comment increased lightheadedness with sit > stand            [facility] Adult PT Treatment/Exercise - 03/26/21 0001      Transfers   Transfers Sit to Stand;Stand to Sit    Sit to Stand 6: Modified independent (Device/Increase time)    Stand to Sit 6: Modified independent (Device/Increase time)    Comments increased time required upon standing due to lightheadedness/dizziness.      Ambulation/Gait   Ambulation/Gait Yes    Ambulation/Gait Assistance 5: Supervision    Ambulation/Gait Assistance Details throughout session w/ activities, mild unsteadiness.  Ambulation Distance (Feet) --   clinic distance   Assistive device None    Gait Pattern Step-through pattern;Wide base of support    Ambulation Surface Level;Indoor      Therapeutic Activites    Therapeutic Activities Other Therapeutic Activities    Other Therapeutic Activities Completed bending down and reaching to objects from seated position, completed  1 x 5 reps bilaterally, w intermittent rest break required due to dizziness. Reviewed and added to HEP           Balance Exercises - 03/26/21 0001      Balance Exercises: Standing   Standing Eyes Opened Wide (BOA);Head turns;Foam/compliant surface;Limitations    Standing Eyes Opened Limitations completed horizontal/vertical head turns on airex x 10 reps each direction, added to HEP.    Standing Eyes Closed Narrow base of support (BOS);Solid surface;3 reps;30 secs;Limitations    Standing Eyes Closed Limitations completed  narrow BOS on firm surface with vision removed.          Access Code: [email or web address] URL: [email or web address] Date: 03/26/2021 Prepared by: Baldomero Lamy  Exercises Standing with Head Rotation on Pillow - 1 x daily - 5 x weekly - 2 sets - 10 reps Standing with Head Nod on Pillow - 1 x daily - 5 x weekly - 2 sets - 10 reps Romberg Stance with Eyes Closed - 1 x daily - 5 x weekly - 1 sets - 3 reps - 30 hold Seated Nose to Knee Vestibular Habituation - 1 x daily - 5 x weekly - 1 sets - 5 reps     PT Education - 03/26/21 1921    Education Details HEP Update    Person(s) Educated Patient    Methods Explanation;Handout;Demonstration    Comprehension Verbalized understanding;Returned demonstration            PT Short Term Goals - 03/26/21 1923      PT SHORT TERM GOAL #1   Title Patient will be independent with initial Vestibular/Balance HEP (all STGs Due: 03/26/21)    Baseline reports independence with HEP    Time 4    Period Weeks    Status Achieved    Target Date 03/26/21      PT SHORT TERM GOAL #2   Title Patient will undergo further assessment (FGA/MSQ) and LTG to be set as appropriate    Baseline assessed and LTG updated    Time 4    Period Weeks    Status Achieved             PT Long Term Goals - 03/12/21 1156      PT LONG TERM GOAL #1   Title Patient will be independent with final Vestibular/Balance HEP (all LTGs Due: 04/27/21)    Baseline no HEP established    Time 8    Period Weeks    Status New      PT LONG TERM GOAL #2   Title Patient will demo ability to hold situaiton 4 of M-CTSIB for >/= 20 seconds to demo improved vestibular input and balance    Baseline 6 secs    Time 8    Period Weeks    Status New      PT LONG TERM GOAL #3   Title Patient will demonstrate </= 2 line difference on DVA to demonstrate improved VOR    Baseline 3 line difference    Time 8    Period Weeks    Status New      PT LONG TERM  GOAL #4   Title Patient  will improve DFS >/= 52 and DPS >/= 55    Baseline DPS 50: DFS: 47.6    Time 8    Period Weeks    Status New      PT LONG TERM GOAL #5   Title Patient will improve FGA to >/= 26/30 to demo improved balance and reduced fall risk    Baseline 23/30    Time 8    Period Weeks    Status New                 Plan - 03/26/21 1922    Clinical Impression Statement Completed assesment of patient's progress toward all STG, able to meet all during session. Reviewed and updated vestibular/balance HEP, with patient tolerating well. Continued seated habituation activites. Due to reports of lightheadedness, PT assessing for orthostatic with patient demonstrating mild drop in systolic and diastolic upon standing with patient symptomatic. Will continue to progress toward all LTGs.    Personal Factors and Comorbidities Time since onset of injury/illness/exacerbation;Comorbidity 3+    Comorbidities Mitral Valve Prolapse, HTN, DM, Prior Hx of Back Surgeries    Examination-Activity Limitations Bend;Stand;Locomotion Level    Examination-Participation Restrictions Driving;Community Activity;Yard Work;Cleaning;Laundry;Meal Prep    Stability/Clinical Decision Making Stable/Uncomplicated    Rehab Potential Good    PT Frequency 1x / week    PT Duration 8 weeks    PT Treatment/Interventions ADLs/Self Care Home Management;Canalith Repostioning;Cryotherapy;Moist Heat;Gait training;Stair training;Functional mobility training;Therapeutic activities;Therapeutic exercise;Balance training;Neuromuscular re-education;Patient/family education;Manual techniques;Passive range of motion;Vestibular;Dry needling    PT Next Visit Plan continue habituation (bending down/reaching), x1 viewing in standing, foam balance. bouncing on ball    Consulted and Agree with Plan of Care Patient           Patient will benefit from skilled therapeutic intervention in order to improve the following deficits and impairments:  Decreased  balance,Decreased activity tolerance,Difficulty walking,Dizziness,Impaired sensation,Pain  Visit Diagnosis: Dizziness and giddiness  Unsteadiness on feet  Difficulty in walking, not elsewhere classified     Problem List There are no problems to display for this patient.   Jones Bales, PT, DPT 03/26/2021, 7:25 PM  [facility] [address] [place], [place], [address] Phone: [phone]   Fax:  [phone]  Name: Leslie Zamora MRN: [id number] Date of Birth: [birth year]

## 2021-03-26 NOTE — Patient Instructions (Signed)
Access Code: [email or web address] URL: [email or web address] Date: 03/26/2021 Prepared by: Baldomero Lamy  Exercises Standing with Head Rotation on Pillow - 1 x daily - 5 x weekly - 2 sets - 10 reps Standing with Head Nod on Pillow - 1 x daily - 5 x weekly - 2 sets - 10 reps Romberg Stance with Eyes Closed - 1 x daily - 5 x weekly - 1 sets - 3 reps - 30 hold Seated Nose to Knee Vestibular Habituation - 1 x daily - 5 x weekly - 1 sets - 5 reps

## 2021-04-03 ENCOUNTER — Ambulatory Visit: Payer: Medicare HMO

## 2021-04-09 ENCOUNTER — Encounter: Payer: Self-pay | Admitting: Physical Therapy

## 2021-04-09 ENCOUNTER — Other Ambulatory Visit: Payer: Self-pay

## 2021-04-09 ENCOUNTER — Ambulatory Visit: Payer: Medicare HMO | Attending: Orthopedic Surgery

## 2021-04-09 ENCOUNTER — Ambulatory Visit: Payer: Medicare HMO | Admitting: Physical Therapy

## 2021-04-09 DIAGNOSIS — G8929 Other chronic pain: Secondary | ICD-10-CM | POA: Insufficient documentation

## 2021-04-09 DIAGNOSIS — M6281 Muscle weakness (generalized): Secondary | ICD-10-CM

## 2021-04-09 DIAGNOSIS — R2681 Unsteadiness on feet: Secondary | ICD-10-CM | POA: Insufficient documentation

## 2021-04-09 DIAGNOSIS — R262 Difficulty in walking, not elsewhere classified: Secondary | ICD-10-CM | POA: Insufficient documentation

## 2021-04-09 DIAGNOSIS — M25611 Stiffness of right shoulder, not elsewhere classified: Secondary | ICD-10-CM

## 2021-04-09 DIAGNOSIS — M25511 Pain in right shoulder: Secondary | ICD-10-CM | POA: Insufficient documentation

## 2021-04-09 DIAGNOSIS — R42 Dizziness and giddiness: Secondary | ICD-10-CM | POA: Insufficient documentation

## 2021-04-09 NOTE — Addendum Note (Signed)
Addended by: Pollyann Samples on: 04/09/2021 06:23 PM   Modules accepted: Orders

## 2021-04-09 NOTE — Therapy (Addendum)
[place], [place], [address] Phone: [phone]   Fax:  [phone]  Physical Therapy Treatment/REEVAL to add Shoulder pain to POC/DISCHARGE SUMMARY  Patient Details  Name: Leslie Zamora MRN: [id number] Date of Birth: 1971/05/01 Referring Provider (PT): Leta Baptist, MD Leslie Nottingham, MD (ortho))   Encounter Date: 04/09/2021   PT End of Session - 04/09/21 1044     Visit Number 7   1 ORTHO visit   Number of Visits 26    Date for PT Re-Evaluation 04/27/21   04/27/2021 for dizziness; 06/04/2021 for shoulder   Authorization Type [employer] (10 Visit PN)    Progress Note Due on Visit 10    PT Start Time 1000    PT Stop Time 1043    PT Time Calculation (min) 43 min    Activity Tolerance Patient tolerated treatment well    Behavior During Therapy [facility] for tasks assessed/performed             Past Medical History:  Diagnosis Date   Diabetes mellitus without complication ([place])    Hypertension    Mitral valve prolapse     Past Surgical History:  Procedure Laterality Date   COLONOSCOPY WITH PROPOFOL N/A 10/17/2017   Procedure: COLONOSCOPY WITH PROPOFOL;  Surgeon: [name], MD;  Location: [facility] ENDOSCOPY;  Service: Endoscopy;  Laterality: N/A;   LUMBAR FUSION  2005,2008, 2014, 2017   [place]   NASAL SEPTUM SURGERY     TONSILLECTOMY     TUBAL LIGATION  2001    There were no vitals filed for this visit.   Subjective Assessment - 04/09/21 1009     Subjective Patient reports gradual increase of R shoulder pain "right after booster shot", has progressed to excruciating pain that awakens at night and with elevation, like washing hair. Patient went to urgent care who referred patient to [facility]. Xray taken. Patient reports that she had a near fall today because of dizziness upon standing and fell into the door frame.    Pertinent History Mitral Valve Prolapse, HTN, DM; Back Surgeries;     Limitations House hold activities;Lifting    Multiple Pain Sites Yes    Pain Score 5   Max 9/10   Pain Location Shoulder    Pain Orientation Right    Pain Descriptors / Indicators Aching;Sharp;Burning;Shooting    Pain Type Chronic pain    Pain Radiating Towards R hand    Pain Onset More than a month ago    Pain Frequency Constant    Aggravating Factors  Sleeping position,    Pain Relieving Factors Heat helps for very short time.    Effect of Pain on Daily Activities Affects all daily activity.                [facility] PT Assessment - 04/09/21 0001       Assessment   Medical Diagnosis Dizziness; M75.21 Right bicepital tendonitis; M75.41 R shoulder impingement    Referring Provider (PT) Leta Baptist, MD   Leslie Nottingham, MD (ortho)   Prior Therapy YES      Precautions   Precautions None      Balance Screen   Has the patient fallen in the past 6 months Yes    How many times? 2    Has the patient had a decrease in activity level because of a fear of falling?  Yes    Is the patient reluctant to leave their home because  of a fear of falling?  Yes      Observation/Other Assessments   Focus on Therapeutic Outcomes (FOTO)  Shoulder FOTO: 44   Predicted 62     Sensation   Light Touch Appears Intact      AROM   Right Shoulder Flexion 90 Degrees    Right Shoulder ABduction 85 Degrees    Right Shoulder Internal Rotation --   L4   Right Shoulder External Rotation 60 Degrees    Left Shoulder Flexion 150 Degrees    Left Shoulder ABduction 150 Degrees    Left Shoulder Internal Rotation --   T3   Left Shoulder External Rotation 75 Degrees      PROM   Right Shoulder Flexion 105 Degrees    Right Shoulder ABduction 110 Degrees      Strength   Right Shoulder Flexion 3-/5    Right Shoulder ABduction 3-/5    Right Shoulder Internal Rotation 4-/5   sharp PAIN with resistance   Right Shoulder External Rotation 4-/5    Left Shoulder Flexion 5/5    Left Shoulder ABduction 5/5    Left Shoulder  Internal Rotation 5/5    Left Shoulder External Rotation 5/5      Palpation   Palpation comment TTP R UT, ant/lat/post GH region.      Special Tests   Other special tests Pain limited for all planes of elevation.                           [facility] Adult PT Treatment/Exercise - 04/09/21 1817       Shoulder Exercises: Stretch   Table Stretch - Flexion 2 reps;30 seconds    Table Stretch - Abduction 2 reps;30 seconds    Table Stretch - ABduction Limitations Scaption    Table Stretch - External Rotation 2 reps;30 seconds                    PT Education - 04/09/21 1037     Education Details Patient given new HEP for shoulder table stretches, and recommended standing lumbar extension to see if LE numbness improves.    Person(s) Educated Patient    Methods Explanation;Demonstration;Verbal cues    Comprehension Verbalized understanding;Returned demonstration;Verbal cues required              PT Short Term Goals - 04/09/21 1355       PT SHORT TERM GOAL #3   Title Shoulder: Patient will be independent with intial HEP to improve ROM and pain symptoms.    Baseline No shoulder exercises    Time 2    Period Weeks    Status New    Target Date 04/23/21               PT Long Term Goals - 04/09/21 1357       Additional Long Term Goals   Additional Long Term Goals Yes      PT LONG TERM GOAL #6   Title Shoulder: Patient will be independent with final HEP and progression to continue to reduce pain and symptoms after discharge.    Baseline No shoulder program    Time 8    Period Weeks    Status New    Target Date 06/04/21      PT LONG TERM GOAL #7   Title FOTO score will increase to 62% as predicted for improved perception of functional abilities.    Baseline 44  Time 8    Period Weeks    Status New    Target Date 06/04/21      PT LONG TERM GOAL #8   Title Patient will improve R shoulder active flexion to >140 for reaching overhead during  household chores with no more than 3/10 pain.    Baseline 90    Time 8    Period Weeks    Status New    Target Date 06/04/21      PT LONG TERM GOAL  #9   TITLE Patient will report ability to fix hair >10 min with no more than 2/10 R shoulder pain.    Baseline Patient rated at 9/10 and compensations to groom hair.    Time 8    Period Weeks    Status New    Target Date 06/04/21                   Plan - 04/09/21 1339     Clinical Impression Statement 50 yo female attending OPPT for balance/dizziness at another [place] location, now referred to OPPT for R shoulder pain. Patient reports gradual increasing pain that started after she got "the booster" and is now so painful that it limits her ability to use R UE for all household and grooming taks. Patient presents to PT evaluation with decreased R UE active ROM in all planes of elevate with slightly better passive motion with firm end feel, also decreased IR/ER ROM althoug fairly stong in ER/IR direction only pain limited more in IR than ER.  Patient symptoms consistant with possible adhesive capsulitis vs rotator cuff involvement.  Patient will benefit from skilled PT to address deficits with ongoing education and assessment to maximize functional use of R UE.    Personal Factors and Comorbidities Time since onset of injury/illness/exacerbation;Comorbidity 3+    Comorbidities Mitral Valve Prolapse, HTN, DM, Prior Hx of Back Surgeries    Examination-Activity Limitations Lift;Reach Overhead;Sleep    Examination-Participation Restrictions Driving;Community Activity;Yard Work;Cleaning;Laundry;Meal Prep    Stability/Clinical Decision Making Stable/Uncomplicated    Clinical Decision Making Low    Rehab Potential Good    PT Frequency 2x / week    PT Duration 8 weeks    PT Treatment/Interventions ADLs/Self Care Home Management;Canalith Repostioning;Cryotherapy;Moist Heat;Gait training;Stair training;Functional mobility training;Therapeutic  activities;Therapeutic exercise;Balance training;Neuromuscular re-education;Patient/family education;Manual techniques;Passive range of motion;Vestibular;Dry needling;Ultrasound;Vasopneumatic Device    PT Next Visit Plan Dizziness: continue habituation (bending down/reaching), x1 viewing in standing, foam balance. bouncing on ball; Shoulder: Assess and progress HEP, continue to assess for R  Rotator Cuff involvement, PROM.    PT Home Exercise Plan Shoulder: [email or web address]    Consulted and Agree with Plan of Care Patient             Patient will benefit from skilled therapeutic intervention in order to improve the following deficits and impairments:  Decreased balance,Decreased activity tolerance,Difficulty walking,Dizziness,Impaired sensation,Pain,Decreased range of motion,Decreased strength,Postural dysfunction  Visit Diagnosis: Chronic right shoulder pain  Stiffness of right shoulder, not elsewhere classified  Muscle weakness (generalized)     Problem List There are no problems to display for this patient.   Pollyann Samples, PT 04/09/2021, 6:20 PM  [facility] [address] [place], [place], [address] Phone: [phone]   Fax:  [phone]  Name: Leslie Zamora MRN: [id number] Date of Birth: [birth year]  PHYSICAL THERAPY DISCHARGE SUMMARY  Visits from Start of Care: 1  Current functional level related to goals / functional outcomes: As  at [facility] - patient added Ortho PT for shoulder and has not returned.   Remaining deficits: As at Shoulder eval.   Education / Equipment: HEP   Patient agrees to discharge. Patient goals were not met. Patient is being discharged due to not returning since the last visit. Patient has not returned since shoulder evaluation on 04/09/2021.  She is not out of certification and will need a new referral to return to Ortho Rehab for her shoulder. She will be discharged today.    Pollyann Samples, PT

## 2021-04-09 NOTE — Patient Instructions (Signed)
Access Code: [email or web address] URL: [email or web address] Date: 04/09/2021 Prepared by: Pollyann Samples  Exercises Seated Shoulder Flexion Towel Slide at Table Top Full Range of Motion - 2 x daily - 7 x weekly - 2 sets - 30sec hold Seated Shoulder Scaption Slide at Table Top with Forearm in Neutral - 2 x daily - 7 x weekly - 2 sets - 30sec hold Seated Shoulder External Rotation PROM on Table - 2 x daily - 7 x weekly - 2 sets - 30sec hold

## 2021-04-09 NOTE — Therapy (Addendum)
[facility] [address] [place], [place], [address] Phone: [phone]   Fax:  [phone]  Physical Therapy Treatment/DISCHARGE SUMMARY  Patient Details  Name: Leslie Zamora MRN: [id number] Date of Birth: 1971/06/14 Referring Provider (PT): Catha Nottingham, MD (ortho)   Encounter Date: 04/09/2021   PT End of Session - 04/09/21 1102     Visit Number 6    Number of Visits 9    Date for PT Re-Evaluation 04/27/21   POC for 8 weeks, Cert for 60 days   Authorization Type [employer] (10 Visit PN)    Progress Note Due on Visit 10    PT Start Time 1102    PT Stop Time 1145    PT Time Calculation (min) 43 min    Equipment Utilized During Treatment Gait belt    Activity Tolerance Patient tolerated treatment well    Behavior During Therapy WFL for tasks assessed/performed             Past Medical History:  Diagnosis Date   Diabetes mellitus without complication ([place])    Hypertension    Mitral valve prolapse     Past Surgical History:  Procedure Laterality Date   COLONOSCOPY WITH PROPOFOL N/A 10/17/2017   Procedure: COLONOSCOPY WITH PROPOFOL;  Surgeon: [name], MD;  Location: [facility] ENDOSCOPY;  Service: Endoscopy;  Laterality: N/A;   LUMBAR FUSION  2005,2008, 2014, 2017   [place]   NASAL SEPTUM SURGERY     TONSILLECTOMY     TUBAL LIGATION  2001    There were no vitals filed for this visit.   Subjective Assessment - 04/09/21 1101     Subjective Patient reports that she had the PT eval for the shoulder pain prior to this appt. Reports that the shouler is bothering her currently. Current dizziness at baseline 3-4/10. Patient reports near fall where she hit the door frame and was able to catch herself, happened due to a quick body turn.    Pertinent History Mitral Valve Prolapse, HTN, DM; Back Surgeries;    Limitations Walking    How long can you stand comfortably? 10-15    Currently in  Pain? Yes    Pain Score 6     Pain Location Shoulder    Pain Orientation Right    Pain Descriptors / Indicators Aching;Burning    Pain Type Acute pain    Pain Onset Today                 [facility] Adult PT Treatment/Exercise - 04/09/21 0001       Transfers   Transfers Sit to Stand;Stand to Sit    Sit to Stand 6: Modified independent (Device/Increase time)    Stand to Sit 6: Modified independent (Device/Increase time)    Comments continue to require increased time between position chagnes due to symptoms      Ambulation/Gait   Ambulation/Gait Yes    Ambulation/Gait Assistance 5: Supervision    Ambulation/Gait Assistance Details throughout therapy session with activities    Ambulation Distance (Feet) --   clinic distance   Assistive device None    Gait Pattern Step-through pattern;Wide base of support    Ambulation Surface Level;Indoor      Therapeutic Activites    Therapeutic Activities Other Therapeutic Activities    Other Therapeutic Activities Completed bending reaching for cones placed on 6" box from standing completed 2 x 6 reps, dizziness 5/10 at end of completion. Intermittent seated rest breaks  required. Completed visual tracking seated with CW circles x 5 reps, then progressed to standing with CW circles x 5 reps. 6/10 dizziness after completion.             Vestibular Treatment/Exercise - 04/09/21 0001       Vestibular Treatment/Exercise   Habituation Exercises 180 degree Turns      180 degree Turns   Number of Reps  3    Symptom Description  initially started with 1/2 turns, using single UE support from counter with 3-4 steps between turns x 5 reps. mild wooziness duirng completion, increased dizziness with increased speed. Progressed to 180 deg turns x 3, cues for slowed pace to reduce overwhelming the system and significant increase in symptoms.                Balance Exercises - 04/09/21 0001       Balance Exercises: Standing   Rockerboard  Anterior/posterior;EO;Head turns;Intermittent UE support;Limitations    Rockerboard Limitations standing on rockerboard positioned A/P, completed standing maintaining board steady 2 x 30 seconds; then progressed to additional of horizontal and vertical head turns x 5 reps each direction, stops required in midline. Increased postural sway and dizziness reported, 6/10 after vertical. patient demo increased challenge with vertical > horizontal.                 PT Short Term Goals - 03/26/21 1923       PT SHORT TERM GOAL #1   Title Patient will be independent with initial Vestibular/Balance HEP (all STGs Due: 03/26/21)    Baseline reports independence with HEP    Time 4    Period Weeks    Status Achieved    Target Date 03/26/21      PT SHORT TERM GOAL #2   Title Patient will undergo further assessment (FGA/MSQ) and LTG to be set as appropriate    Baseline assessed and LTG updated    Time 4    Period Weeks    Status Achieved               PT Long Term Goals - 03/12/21 1156       PT LONG TERM GOAL #1   Title Patient will be independent with final Vestibular/Balance HEP (all LTGs Due: 04/27/21)    Baseline no HEP established    Time 8    Period Weeks    Status New      PT LONG TERM GOAL #2   Title Patient will demo ability to hold situaiton 4 of M-CTSIB for >/= 20 seconds to demo improved vestibular input and balance    Baseline 6 secs    Time 8    Period Weeks    Status New      PT LONG TERM GOAL #3   Title Patient will demonstrate </= 2 line difference on DVA to demonstrate improved VOR    Baseline 3 line difference    Time 8    Period Weeks    Status New      PT LONG TERM GOAL #4   Title Patient will improve DFS >/= 52 and DPS >/= 55    Baseline DPS 50: DFS: 47.6    Time 8    Period Weeks    Status New      PT LONG TERM GOAL #5   Title Patient will improve FGA to >/= 26/30 to demo improved balance and reduced fall risk    Baseline 23/30  Time 8     Period Weeks    Status New                   Plan - 04/09/21 1201     Clinical Impression Statement Continued habituation training today focused on seated/standing bending and reaching, as well as beginning to initiate habituation to half and 180 deg turns. intermittent rest breaks required due to symptoms. Completed balance on rockerboard with addition of head turns, increased challenge with vertical > horizontal.    Personal Factors and Comorbidities Time since onset of injury/illness/exacerbation;Comorbidity 3+    Comorbidities Mitral Valve Prolapse, HTN, DM, Prior Hx of Back Surgeries    Examination-Activity Limitations Bend;Stand;Locomotion Level    Examination-Participation Restrictions Driving;Community Activity;Yard Work;Cleaning;Laundry;Meal Prep    Stability/Clinical Decision Making Stable/Uncomplicated    Rehab Potential Good    PT Frequency 1x / week    PT Duration 8 weeks    PT Treatment/Interventions ADLs/Self Care Home Management;Canalith Repostioning;Cryotherapy;Moist Heat;Gait training;Stair training;Functional mobility training;Therapeutic activities;Therapeutic exercise;Balance training;Neuromuscular re-education;Patient/family education;Manual techniques;Passive range of motion;Vestibular;Dry needling    PT Next Visit Plan continue habituation (bending down/reaching), x1 viewing in standing, foam balance. bouncing on ball    Consulted and Agree with Plan of Care Patient             Patient will benefit from skilled therapeutic intervention in order to improve the following deficits and impairments:  Decreased balance,Decreased activity tolerance,Difficulty walking,Dizziness,Impaired sensation,Pain  Visit Diagnosis: Dizziness and giddiness  Unsteadiness on feet  Difficulty in walking, not elsewhere classified     Problem List There are no problems to display for this patient.   Jones Bales, PT, DPT 04/09/2021, 12:05 PM  [facility] [address] [place], [place], [address] Phone: [phone]   Fax:  [phone]  Name: Leslie Zamora MRN: [id number] Date of Birth: [birth year]  PHYSICAL THERAPY DISCHARGE SUMMARY  Visits from Start of Care: 6  Current functional level related to goals / functional outcomes: Last visit > 16monthago   Remaining deficits: See assessment above   Education / Equipment: HEP, pain management   Patient agrees to discharge. Patient goals were partially met. Patient is being discharged due to not returning since the last visit.  Last visit >1 month ago and no longer in certification.    DPollyann Samples PT

## 2021-04-16 ENCOUNTER — Ambulatory Visit: Payer: Medicare HMO

## 2021-04-23 ENCOUNTER — Ambulatory Visit: Payer: Medicare HMO

## 2021-04-25 ENCOUNTER — Ambulatory Visit: Payer: Medicare HMO | Admitting: Physical Therapy

## 2021-04-30 ENCOUNTER — Other Ambulatory Visit: Payer: Self-pay

## 2021-04-30 ENCOUNTER — Encounter: Payer: Self-pay | Admitting: Obstetrics and Gynecology

## 2021-04-30 ENCOUNTER — Ambulatory Visit (INDEPENDENT_AMBULATORY_CARE_PROVIDER_SITE_OTHER): Payer: Medicare HMO | Admitting: Obstetrics and Gynecology

## 2021-04-30 ENCOUNTER — Other Ambulatory Visit (HOSPITAL_COMMUNITY)
Admission: RE | Admit: 2021-04-30 | Discharge: 2021-04-30 | Disposition: A | Discharge status: Home / Self Care | Payer: Medicare HMO | Source: Ambulatory Visit | Attending: Obstetrics and Gynecology | Admitting: Obstetrics and Gynecology

## 2021-04-30 VITALS — BP 122/72 | HR 83 | Ht 63.0 in | Wt 224.0 lb

## 2021-04-30 DIAGNOSIS — Z124 Encounter for screening for malignant neoplasm of cervix: Secondary | ICD-10-CM | POA: Insufficient documentation

## 2021-04-30 DIAGNOSIS — Z1239 Encounter for other screening for malignant neoplasm of breast: Secondary | ICD-10-CM

## 2021-04-30 DIAGNOSIS — N951 Menopausal and female climacteric states: Secondary | ICD-10-CM

## 2021-04-30 DIAGNOSIS — Z1231 Encounter for screening mammogram for malignant neoplasm of breast: Secondary | ICD-10-CM

## 2021-04-30 DIAGNOSIS — Z01419 Encounter for gynecological examination (general) (routine) without abnormal findings: Secondary | ICD-10-CM

## 2021-04-30 NOTE — Patient Instructions (Signed)
[facility] [address] [place] [address]  [facility]  [address] [place] [address]  Phone: [phone]  
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
## 2021-05-01 LAB — ESTRADIOL: Estradiol: 23.9 pg/mL

## 2021-05-01 LAB — FOLLICLE STIMULATING HORMONE: FSH: 21.5 m[IU]/mL

## 2021-05-02 LAB — CYTOLOGY - PAP
Adequacy: ABSENT
Diagnosis: NEGATIVE

## 2021-05-18 ENCOUNTER — Ambulatory Visit
Admission: RE | Admit: 2021-05-18 | Discharge: 2021-05-18 | Disposition: A | Discharge status: Home / Self Care | Payer: Medicare HMO | Source: Ambulatory Visit | Attending: Obstetrics and Gynecology | Admitting: Obstetrics and Gynecology

## 2021-05-18 ENCOUNTER — Other Ambulatory Visit: Payer: Self-pay

## 2021-05-18 DIAGNOSIS — Z1231 Encounter for screening mammogram for malignant neoplasm of breast: Secondary | ICD-10-CM | POA: Diagnosis present

## 2021-05-28 ENCOUNTER — Other Ambulatory Visit: Payer: Self-pay | Admitting: Obstetrics and Gynecology

## 2021-06-28 DIAGNOSIS — M79676 Pain in unspecified toe(s): Secondary | ICD-10-CM

## 2021-10-20 ENCOUNTER — Emergency Department: Payer: Medicare HMO

## 2021-10-20 ENCOUNTER — Encounter: Payer: Self-pay | Admitting: Radiology

## 2021-10-20 ENCOUNTER — Observation Stay
Admission: EM | Admit: 2021-10-20 | Discharge: 2021-10-21 | Disposition: A | Discharge status: Home / Self Care | Payer: Medicare HMO | Attending: Internal Medicine | Admitting: Internal Medicine

## 2021-10-20 ENCOUNTER — Other Ambulatory Visit: Payer: Self-pay

## 2021-10-20 DIAGNOSIS — E1169 Type 2 diabetes mellitus with other specified complication: Secondary | ICD-10-CM

## 2021-10-20 DIAGNOSIS — E119 Type 2 diabetes mellitus without complications: Secondary | ICD-10-CM | POA: Diagnosis not present

## 2021-10-20 DIAGNOSIS — G43909 Migraine, unspecified, not intractable, without status migrainosus: Secondary | ICD-10-CM

## 2021-10-20 DIAGNOSIS — I471 Supraventricular tachycardia, unspecified: Secondary | ICD-10-CM | POA: Diagnosis present

## 2021-10-20 DIAGNOSIS — Z79899 Other long term (current) drug therapy: Secondary | ICD-10-CM | POA: Insufficient documentation

## 2021-10-20 DIAGNOSIS — Z20822 Contact with and (suspected) exposure to covid-19: Secondary | ICD-10-CM | POA: Insufficient documentation

## 2021-10-20 DIAGNOSIS — F419 Anxiety disorder, unspecified: Secondary | ICD-10-CM

## 2021-10-20 DIAGNOSIS — D72829 Elevated white blood cell count, unspecified: Secondary | ICD-10-CM | POA: Diagnosis not present

## 2021-10-20 DIAGNOSIS — R0602 Shortness of breath: Secondary | ICD-10-CM | POA: Diagnosis not present

## 2021-10-20 DIAGNOSIS — I152 Hypertension secondary to endocrine disorders: Secondary | ICD-10-CM

## 2021-10-20 DIAGNOSIS — F17210 Nicotine dependence, cigarettes, uncomplicated: Secondary | ICD-10-CM | POA: Diagnosis not present

## 2021-10-20 DIAGNOSIS — E669 Obesity, unspecified: Secondary | ICD-10-CM

## 2021-10-20 DIAGNOSIS — R000 Tachycardia, unspecified: Secondary | ICD-10-CM | POA: Diagnosis present

## 2021-10-20 DIAGNOSIS — G629 Polyneuropathy, unspecified: Secondary | ICD-10-CM

## 2021-10-20 DIAGNOSIS — Z7984 Long term (current) use of oral hypoglycemic drugs: Secondary | ICD-10-CM | POA: Diagnosis not present

## 2021-10-20 DIAGNOSIS — Z7982 Long term (current) use of aspirin: Secondary | ICD-10-CM | POA: Insufficient documentation

## 2021-10-20 DIAGNOSIS — F32A Depression, unspecified: Secondary | ICD-10-CM

## 2021-10-20 DIAGNOSIS — I10 Essential (primary) hypertension: Secondary | ICD-10-CM | POA: Diagnosis not present

## 2021-10-20 DIAGNOSIS — E1159 Type 2 diabetes mellitus with other circulatory complications: Secondary | ICD-10-CM

## 2021-10-20 DIAGNOSIS — R079 Chest pain, unspecified: Secondary | ICD-10-CM

## 2021-10-20 HISTORY — DX: Obesity, unspecified: E66.9

## 2021-10-20 LAB — CBC
HCT: 41.6 % (ref 36.0–46.0)
Hemoglobin: 14 g/dL (ref 12.0–15.0)
MCH: 31.1 pg (ref 26.0–34.0)
MCHC: 33.7 g/dL (ref 30.0–36.0)
MCV: 92.4 fL (ref 80.0–100.0)
Platelets: 445 10*3/uL — ABNORMAL HIGH (ref 150–400)
RBC: 4.5 MIL/uL (ref 3.87–5.11)
RDW: 13.2 % (ref 11.5–15.5)
WBC: 21.6 10*3/uL — ABNORMAL HIGH (ref 4.0–10.5)
nRBC: 0 % (ref 0.0–0.2)

## 2021-10-20 LAB — COMPREHENSIVE METABOLIC PANEL
ALT: 37 U/L (ref 0–44)
AST: 29 U/L (ref 15–41)
Albumin: 3.6 g/dL (ref 3.5–5.0)
Alkaline Phosphatase: 80 U/L (ref 38–126)
Anion gap: 9 (ref 5–15)
BUN: 14 mg/dL (ref 6–20)
CO2: 23 mmol/L (ref 22–32)
Calcium: 9.8 mg/dL (ref 8.9–10.3)
Chloride: 103 mmol/L (ref 98–111)
Creatinine, Ser: 0.72 mg/dL (ref 0.44–1.00)
GFR, Estimated: >60 mL/min (ref >60)
Glucose, Bld: 186 mg/dL — ABNORMAL HIGH (ref 70–99)
Potassium: 4.1 mmol/L (ref 3.5–5.1)
Sodium: 135 mmol/L (ref 135–145)
Total Bilirubin: 0.7 mg/dL (ref 0.3–1.2)
Total Protein: 7.6 g/dL (ref 6.5–8.1)

## 2021-10-20 LAB — RESP PANEL BY RT-PCR (FLU A&B, COVID) ARPGX2
Influenza A by PCR: NEGATIVE
Influenza B by PCR: NEGATIVE
SARS Coronavirus 2 by RT PCR: NEGATIVE

## 2021-10-20 LAB — TROPONIN I (HIGH SENSITIVITY): Troponin I (High Sensitivity): 93 ng/L — ABNORMAL HIGH (ref <18)

## 2021-10-20 MED ORDER — HYDROCODONE-ACETAMINOPHEN 5-325 MG PO TABS: 1.0000 | ORAL_TABLET | Freq: Four times a day (QID) | ORAL | Status: DC | PRN

## 2021-10-20 MED ORDER — INSULIN ASPART 100 UNIT/ML IJ SOLN: 0.0000 [IU] | Freq: Every day | INTRAMUSCULAR | Status: DC

## 2021-10-20 MED ORDER — NICOTINE 21 MG/24HR TD PT24: 21.0000 mg | MEDICATED_PATCH | Freq: Every day | TRANSDERMAL | Status: DC | PRN

## 2021-10-20 MED ORDER — METFORMIN HCL ER 500 MG PO TB24
500.0000 mg | ORAL_TABLET | Freq: Two times a day (BID) | ORAL | Status: DC
  Administered 2021-10-21: 09:00:00 500 mg via ORAL
  Filled 2021-10-20 (×2): qty 1

## 2021-10-20 MED ORDER — ENOXAPARIN SODIUM 60 MG/0.6ML IJ SOSY: 0.5000 mg/kg | PREFILLED_SYRINGE | INTRAMUSCULAR | Status: DC

## 2021-10-20 MED ORDER — ADENOSINE 6 MG/2ML IV SOLN
6.0000 mg | Freq: Once | INTRAVENOUS | Status: AC
  Administered 2021-10-20: 6 mg via INTRAVENOUS
  Filled 2021-10-20: qty 2

## 2021-10-20 MED ORDER — INSULIN ASPART 100 UNIT/ML IJ SOLN: 0.0000 [IU] | Freq: Three times a day (TID) | INTRAMUSCULAR | Status: DC

## 2021-10-20 MED ORDER — ONDANSETRON HCL 4 MG PO TABS: 4.0000 mg | ORAL_TABLET | Freq: Four times a day (QID) | ORAL | Status: DC | PRN

## 2021-10-20 MED ORDER — AMITRIPTYLINE HCL 50 MG PO TABS
50.0000 mg | ORAL_TABLET | Freq: Every day | ORAL | Status: DC
  Administered 2021-10-21: 01:00:00 50 mg via ORAL
  Filled 2021-10-20: qty 1

## 2021-10-20 MED ORDER — ATORVASTATIN CALCIUM 20 MG PO TABS
80.0000 mg | ORAL_TABLET | Freq: Every day | ORAL | Status: DC
  Administered 2021-10-21: 09:00:00 80 mg via ORAL
  Filled 2021-10-20: qty 4

## 2021-10-20 MED ORDER — ALBUTEROL SULFATE HFA 108 (90 BASE) MCG/ACT IN AERS: 2.0000 | INHALATION_SPRAY | RESPIRATORY_TRACT | Status: DC | PRN

## 2021-10-20 MED ORDER — SUMATRIPTAN SUCCINATE 25 MG PO TABS
100.0000 mg | ORAL_TABLET | ORAL | Status: DC | PRN
  Filled 2021-10-20: qty 4

## 2021-10-20 MED ORDER — DULOXETINE HCL 60 MG PO CPEP
60.0000 mg | ORAL_CAPSULE | Freq: Every day | ORAL | Status: DC
  Filled 2021-10-20: qty 1

## 2021-10-20 MED ORDER — ASPIRIN-ACETAMINOPHEN-CAFFEINE 250-250-65 MG PO TABS
2.0000 | ORAL_TABLET | Freq: Four times a day (QID) | ORAL | Status: DC | PRN
  Filled 2021-10-20: qty 2

## 2021-10-20 MED ORDER — ACETAMINOPHEN 325 MG PO TABS: 650.0000 mg | ORAL_TABLET | Freq: Four times a day (QID) | ORAL | Status: DC | PRN

## 2021-10-20 MED ORDER — PANTOPRAZOLE SODIUM 40 MG PO TBEC
40.0000 mg | DELAYED_RELEASE_TABLET | Freq: Every day | ORAL | Status: DC
  Administered 2021-10-21: 09:00:00 40 mg via ORAL
  Filled 2021-10-20: qty 1

## 2021-10-20 MED ORDER — MEDROXYPROGESTERONE ACETATE 10 MG PO TABS
20.0000 mg | ORAL_TABLET | Freq: Every day | ORAL | Status: DC
  Administered 2021-10-21: 11:00:00 20 mg via ORAL
  Filled 2021-10-20: qty 2

## 2021-10-20 MED ORDER — SODIUM CHLORIDE 0.9 % IV BOLUS
1000.0000 mL | Freq: Once | INTRAVENOUS | Status: AC
  Administered 2021-10-20: 1000 mL via INTRAVENOUS

## 2021-10-20 MED ORDER — METOPROLOL TARTRATE 25 MG PO TABS
25.0000 mg | ORAL_TABLET | Freq: Once | ORAL | Status: DC
  Filled 2021-10-20: qty 1

## 2021-10-20 MED ORDER — METOPROLOL TARTRATE 5 MG/5ML IV SOLN: 5.0000 mg | INTRAVENOUS | Status: DC | PRN

## 2021-10-20 MED ORDER — METOPROLOL TARTRATE 5 MG/5ML IV SOLN
INTRAVENOUS | Status: AC
  Administered 2021-10-20: 5 mg via INTRAVENOUS
  Filled 2021-10-20: qty 5

## 2021-10-20 MED ORDER — LORAZEPAM 0.5 MG PO TABS: 0.5000 mg | ORAL_TABLET | Freq: Four times a day (QID) | ORAL | Status: DC | PRN

## 2021-10-20 MED ORDER — ASPIRIN 81 MG PO CHEW
324.0000 mg | CHEWABLE_TABLET | Freq: Once | ORAL | Status: AC
  Administered 2021-10-21: 01:00:00 324 mg via ORAL
  Filled 2021-10-20: qty 4

## 2021-10-20 MED ORDER — FLUTICASONE PROPIONATE 50 MCG/ACT NA SUSP
1.0000 | Freq: Every day | NASAL | Status: DC | PRN
  Filled 2021-10-20: qty 16

## 2021-10-20 MED ORDER — ONDANSETRON HCL 4 MG/2ML IJ SOLN: 4.0000 mg | Freq: Four times a day (QID) | INTRAMUSCULAR | Status: DC | PRN

## 2021-10-20 MED ORDER — LISINOPRIL-HYDROCHLOROTHIAZIDE 20-25 MG PO TABS: 1.0000 | ORAL_TABLET | Freq: Every day | ORAL | Status: DC

## 2021-10-20 MED ORDER — ACETAMINOPHEN 325 MG RE SUPP: 650.0000 mg | Freq: Four times a day (QID) | RECTAL | Status: DC | PRN

## 2021-10-20 MED ORDER — METOPROLOL TARTRATE 5 MG/5ML IV SOLN: 5.0000 mg | INTRAVENOUS | Status: AC

## 2021-10-20 NOTE — Progress Notes (Signed)
PHARMACIST - PHYSICIAN COMMUNICATION  CONCERNING:  Enoxaparin (Lovenox) for DVT Prophylaxis    RECOMMENDATION: Patient was prescribed enoxaprin 40mg  q24 hours for VTE prophylaxis.   Filed Weights   10/20/21 2112  Weight: 101.2 kg (223 lb)    Body mass index is 39.5 kg/m.  Estimated Creatinine Clearance: 95.5 mL/min (by C-G formula based on SCr of 0.72 mg/dL).   Based on [place] patient is candidate for enoxaparin 0.5mg /kg TBW SQ every 24 hours based on BMI being >30.  DESCRIPTION: Pharmacy has adjusted enoxaparin dose per [facility] policy.  Patient is now receiving enoxaparin 0.5 mg/kg every 24 hours   Renda Rolls, PharmD, [facility] 10/20/2021 11:54 PM

## 2021-10-20 NOTE — ED Provider Notes (Signed)
[facility] Provider Note    Event Date/Time   First MD Initiated Contact with Patient 10/20/21 2122     (approximate)  I have reviewed the triage vital signs and the nursing notes.   HISTORY  Chief Complaint Tachycardia    HPI Glori Machnik is a 50 y.o. female history of mitral valve prolapse as well as diabetes and hypertension history of smoking presents to the ER for chest discomfort shortness of breath and palpitations that occurred this afternoon patient was recently traveling back from visiting her husband's grave site.  States that while stopping at a restaurant she bent down to pick something up when she came back up felt her heart racing.  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  Tried an Ativan without any difference or improvement.  She denies any history of palpitations or dysrhythmia.  Past Medical History:  Diagnosis Date   Diabetes mellitus without complication ([place])    Hypertension    Mitral valve prolapse    Family History  Adopted: Yes  Problem Relation Age of Onset   Ovarian cancer Sister 47   Past Surgical History:  Procedure Laterality Date   COLONOSCOPY WITH PROPOFOL N/A 10/17/2017   Procedure: COLONOSCOPY WITH PROPOFOL;  Surgeon: [name], MD;  Location: [facility]COPY;  Service: Endoscopy;  Laterality: N/A;   LUMBAR FUSION  2005,2008, 2014, 2017   [place]   NASAL SEPTUM SURGERY     TONSILLECTOMY     TUBAL LIGATION  2001   There are no problems to display for this patient.     Prior to Admission medications   Medication Sig Start Date End Date Taking? Authorizing Provider  albuterol (VENTOLIN HFA) 108 (90 Base) MCG/ACT inhaler Inhale 2 puffs into the lungs every 4 (four) hours as needed for wheezing or shortness of breath. 08/18/19  Yes Cuthriell,  Charline Bills, PA-C  amitriptyline (ELAVIL) 50 MG tablet Take 50 mg by mouth at bedtime. 03/10/20  Yes [provider]  aspirin-acetaminophen-caffeine (EXCEDRIN MIGRAINE) [phone] MG tablet Take 2 tablets by mouth every 6 (six) hours as needed.   Yes [provider]  atorvastatin (LIPITOR) 80 MG tablet Take 80 mg by mouth daily. 08/21/21  Yes [provider]  baclofen (LIORESAL) 20 MG tablet Take 20 mg by mouth 3 (three) times daily as needed. 09/11/20  Yes [provider]  BIOTIN PO Take 1 capsule by mouth daily.   Yes [provider]  cetirizine (ZYRTEC) 10 MG tablet Take 10 mg by mouth daily.   Yes [provider]  DULoxetine (CYMBALTA) 60 MG capsule Take 60 mg by mouth daily. 01/31/21  Yes [provider]  fluticasone (FLONASE) 50 MCG/ACT nasal spray USE 1 SQUIRT IN EACH NOSTRIL DAILY 05/23/17  Yes [provider]  gabapentin (NEURONTIN) 600 MG tablet Take 600 mg by mouth 4 (four) times daily as needed. 10/02/21  Yes [provider]  HYDROcodone-acetaminophen (NORCO/VICODIN) 5-325 MG tablet Take 1 tablet by mouth every 6 (six) hours as needed. 5-325 mg 01/15/20  Yes [provider]  lisinopril-hydrochlorothiazide (ZESTORETIC) 20-25 MG tablet Take 1 tablet by mouth daily. 03/23/20  Yes [provider]  LORazepam (ATIVAN) 0.5 MG tablet Take 0.5 mg by mouth every 8 (eight) hours.   Yes [provider]  MECLIZINE HCL  PO Take 25 mg by mouth as needed.   Yes [provider]  medroxyPROGESTERone (PROVERA) 10 MG tablet TAKE 2 TABLETS EVERY DAY 05/28/21  Yes Malachy Mood, MD  metFORMIN (GLUCOPHAGE-XR) 500 MG 24 hr tablet Take 500 mg by mouth in the morning and at bedtime. With meals 01/02/21  Yes [provider]  Multiple Vitamin (MULTIVITAMIN WITH MINERALS) TABS tablet Take 1 tablet by mouth daily.   Yes [provider]  pantoprazole (PROTONIX) 40 MG tablet Take 1 tablet by  mouth daily. 10/13/20  Yes [provider]  rizatriptan (MAXALT-MLT) 10 MG disintegrating tablet Take 10 mg by mouth as needed. May repeat in 2 hrs if needed. Max dose 2 in 24 hrs 07/22/19  Yes [provider]  Semaglutide,0.25 or 0.5MG /DOS, (OZEMPIC, 0.25 OR 0.5 MG/DOSE,) 2 MG/1.5ML SOPN Inject 0.25mg  once a week Patient not taking: Reported on 10/20/2021 03/27/21   [provider]    Allergies Cephalexin, Other, Clindamycin/lincomycin, Gabapentin, Prednisone, and Topiramate    Social History Social History   Tobacco Use   Smoking status: Every Day    Packs/day: 1.00    Types: Cigarettes   Smokeless tobacco: Never  Vaping Use   Vaping Use: Never used  Substance Use Topics   Alcohol use: Yes    Comment: occ   Drug use: No    Review of Systems Patient denies headaches, rhinorrhea, blurry vision, numbness, shortness of breath, chest pain, edema, cough, abdominal pain, nausea, vomiting, diarrhea, dysuria, fevers, rashes or hallucinations unless otherwise stated above in HPI. ____________________________________________   PHYSICAL EXAM:  VITAL SIGNS: Vitals:   10/20/21 2215 10/20/21 2230  BP: (!) 93/54 (!) 93/55  Pulse: 87 85  Resp: 17 15  Temp:    SpO2: 96% 96%    Constitutional: Alert and oriented. diaphoretic  Eyes: Conjunctivae are normal.  Head: Atraumatic. Nose: No congestion/rhinnorhea. Mouth/Throat: Mucous membranes are moist.   Neck: No stridor. Painless ROM.  Cardiovascular: tachycardic, regular rhythm. Grossly normal heart sounds.  Good peripheral circulation. Respiratory: Normal respiratory effort.  No retractions. Lungs CTAB. Gastrointestinal: Soft and nontender. No distention. No abdominal bruits. No CVA tenderness. Genitourinary:  Musculoskeletal: No lower extremity tenderness nor edema.  No joint effusions. Neurologic:  Normal speech and language. No gross focal neurologic deficits are appreciated. No facial droop Skin:   Skin is warm, dry and intact. No rash noted. Psychiatric: Mood and affect are normal. Speech and behavior are normal.  ____________________________________________   LABS (all labs ordered are listed, but only abnormal results are displayed)  Results for orders placed or performed during the [facility] encounter of 10/20/21 (from the past 24 hour(s))  CBC     Status: Abnormal   Collection Time: 10/20/21  9:13 PM  Result Value Ref Range   WBC 21.6 (H) 4.0 - 10.5 K/uL   RBC 4.50 3.87 - 5.11 MIL/uL   Hemoglobin 14.0 12.0 - 15.0 g/dL   HCT 41.6 36.0 - 46.0 %   MCV 92.4 80.0 - 100.0 fL   MCH 31.1 26.0 - 34.0 pg   MCHC 33.7 30.0 - 36.0 g/dL   RDW 13.2 11.5 - 15.5 %   Platelets 445 (H) 150 - 400 K/uL   nRBC 0.0 0.0 - 0.2 %  Comprehensive metabolic panel     Status: Abnormal   Collection Time: 10/20/21  9:13 PM  Result Value Ref Range   Sodium 135 135 - 145 mmol/L   Potassium 4.1 3.5 - 5.1 mmol/L   Chloride 103  98 - 111 mmol/L   CO2 23 22 - 32 mmol/L   Glucose, Bld 186 (H) 70 - 99 mg/dL   BUN 14 6 - 20 mg/dL   Creatinine, Ser 0.72 0.44 - 1.00 mg/dL   Calcium 9.8 8.9 - 10.3 mg/dL   Total Protein 7.6 6.5 - 8.1 g/dL   Albumin 3.6 3.5 - 5.0 g/dL   AST 29 15 - 41 U/L   ALT 37 0 - 44 U/L   Alkaline Phosphatase 80 38 - 126 U/L   Total Bilirubin 0.7 0.3 - 1.2 mg/dL   GFR, Estimated >60 >60 mL/min   Anion gap 9 5 - 15  Troponin I (High Sensitivity)     Status: Abnormal   Collection Time: 10/20/21  9:13 PM  Result Value Ref Range   Troponin I (High Sensitivity) 93 (H) <18 ng/L  Resp Panel by RT-PCR (Flu A&B, Covid) Nasopharyngeal Swab     Status: None   Collection Time: 10/20/21  9:58 PM   Specimen: Nasopharyngeal Swab; Nasopharyngeal(NP) swabs in vial transport medium  Result Value Ref Range   SARS Coronavirus 2 by RT PCR NEGATIVE NEGATIVE   Influenza A by PCR NEGATIVE NEGATIVE   Influenza B by PCR NEGATIVE NEGATIVE   ____________________________________________  EKG My review  and personal interpretation at Time: 21:04   Indication: tachycardia  Rate: 185  Rhythm: svt Axis: normal Other: svt, abnml ekg   My review and personal interpretation at Time: 22:29 Indication: tachycardia  Rate: 85  Rhythm: sinus Axis: normal Other: no stemi, no depression ____________________________________________  RADIOLOGY  I personally reviewed all radiographic images ordered to evaluate for the above acute complaints and reviewed radiology reports and findings.  These findings were personally discussed with the patient.  Please see medical record for radiology report.  ____________________________________________   PROCEDURES  Procedure(s) performed:  .Critical Care Performed by: Merlyn Lot, MD Authorized by: Merlyn Lot, MD   Critical care provider statement:    Critical care time (minutes):  35   Critical care was necessary to treat or prevent imminent or life-threatening deterioration of the following conditions:  Circulatory failure   Critical care was time spent personally by me on the following activities:  Ordering and performing treatments and interventions, ordering and review of laboratory studies, ordering and review of radiographic studies, pulse oximetry, re-evaluation of patient's condition, review of old charts, obtaining history from patient or surrogate, examination of patient, evaluation of patient's response to treatment, discussions with primary provider, discussions with consultants and development of treatment plan with patient or surrogate    Critical Care performed: yes ____________________________________________   INITIAL IMPRESSION / [place] / ED COURSE  Pertinent labs & imaging results that were available during my care of the patient were reviewed by me and considered in my medical decision making (see chart for details).   DDX: Dysrhythmia, ACS, electrolyte abnormality, dehydration, sepsis, PE,  pneumothorax  Adilen Pavelko is a 50 y.o. who presents to the ED with symptoms as described above.  Patient ill-appearing diaphoretic but mentating well normotensive, tachycardic in the 180s to 190s consistent with SVT.  Patient was immediately placed on cardiac monitor, defibrillator pads.  She consented to receiving 6 of adenosine IV which successfully cardioverted her to sinus rhythm.  Patient given IV fluids as well as IV Lopressor.  Blood work sent for the above differential.  Clinical Course as of 10/20/21 2306  Sat Oct 20, 2021  2300 Patient COVID-negative.  Blood pressures are  soft though she is feeling improved complains of generalized malaise and weakness right now.  Her troponin is elevated and I think this is likely secondary to demand ischemia with prolonged SVT however given her age and risk factors with no previous cardiac work-up I do believe she warrants observation for serial acute troponins and monitoring. [PR]  2301 Influenza A By PCR: NEGATIVE [PR]    Clinical Course User Index [PR] Merlyn Lot, MD    The patient was evaluated in Emergency Department today for the symptoms described in the history of present illness. He/she was evaluated in the context of the global COVID-19 pandemic, which necessitated consideration that the patient might be at risk for infection with the SARS-CoV-2 virus that causes COVID-19. Institutional protocols and algorithms that pertain to the evaluation of patients at risk for COVID-19 are in a state of rapid change based on information released by regulatory bodies including the CDC and federal and state organizations. These policies and algorithms were followed during the patient's care in the ED.  As part of my medical decision making, I reviewed the following data within the [place] notes reviewed and incorporated, Labs reviewed, notes from prior ED visits and [place] Controlled Substance  Database   ____________________________________________   FINAL CLINICAL IMPRESSION(S) / ED DIAGNOSES  Final diagnoses:  SVT (supraventricular tachycardia) (HCC)  Chest pain, unspecified type      NEW MEDICATIONS STARTED DURING THIS VISIT:  New Prescriptions   No medications on file     Note:  This document was prepared using Dragon voice recognition software and may include unintentional dictation errors.    Merlyn Lot, MD 10/20/21 2306

## 2021-10-20 NOTE — H&P (Signed)
History and Physical   Leslie Zamora ASN:[id number] DOB: [birth year] DOA: 10/20/2021  PCP: Nicholes Rough, PA-C  Outpatient Specialists: Dr. Georgianne Fick, OBGyn; Dr. Clista Bernhardt, [place] [facility] Patient coming from: Home via private vehicle  I have personally briefly reviewed patient's old medical records in [place].  Chief Concern: Elevated heart rate  HPI: Leslie Zamora is a 50 y.o. female with medical history significant for tobacco use, hypertension, mitral valve prolapse, non-insulin-dependent diabetes mellitus, anxiety, migraine headaches, hyperlipidemia, depression, neuropathy, who presents emergency department for chief concerns of elevated heart rate.  She was returning from a grave site of her Mother in [place]. She went to the site with her husband and then they went to the grocery store after.   She states she was bending down to pick up a box of brownie mix at the grocery. When she stood up, she felt a 'hollow sucking, weird sensation' and right upper pain that was sharp, brief, intermittent.   She endorses labored breathing, diaphoretic.  She reports she is never felt this way before.  She endorses that her HR was this high about two years ago and she held her breath and it went away. She presented to her cardiologist in 2018 and wore a heart monitor for 1 week and per patient, nothing was captured.   She endorsed a new cough that started 3 weeks with associated congestion. She denies sick contacts, fever, nausea, vomiting, diarrhea, dysuria.  She is not sexually active. She endorses swelling of her bilateral lower extremity and this has been intermittent since she has had chronic back pain.  She endorses that she used to be on heavy opioids with p.o. morphine.  She states that she is no longer on these medications since she started growing her own THC pods and using it for pain control.  Social history: She lives with her husband. She smokes 1/2 ppd per day, at her  peak she smoked 1 ppd. She is trying to quit by Christmas. She infrequently drinks etoh, and last drink was two weeks, two beers. She denies IV drug use. 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  Vaccination history: She is vaccinated for covid 19, with 3 doses, Pfizer  ROS: Constitutional: no weight change, no fever ENT/Mouth: no sore throat, no rhinorrhea Eyes: no eye pain, no vision changes Cardiovascular: no chest pain, + dyspnea,  no edema, no palpitations Respiratory: no cough, no sputum, no wheezing Gastrointestinal: no nausea, no vomiting, no diarrhea, no constipation Genitourinary: no urinary incontinence, no dysuria, no hematuria Musculoskeletal: no arthralgias, no myalgias Skin: no skin lesions, no pruritus, Neuro: + weakness, no loss of consciousness, no syncope Psych: no anxiety, no depression, + decrease appetite Heme/Lymph: no bruising, no bleeding  ED Course: Discussed with emergency medicine provider, patient requiring hospitalization for chief concerns of supraventricular tachycardia.  Vitals in the emergency department was remarkable for temperature of 98.2, respiration rate of 18, initial heart rate was SVT 186, now 86, blood pressure is 99/76, SPO2 of 96% on room air.  Labs in the emergency department was remarkable for serum sodium 135, potassium 4.1, chloride 103, bicarb 23, BUN of 14, serum creatinine of 0.72, nonfasting blood glucose 186, GFR greater than 60, WBC 21.6, hemoglobin 14, platelets 445.  In the emergency department patient received adenosine 6 mg IV once, aspirin 324 mg p.o. once, metoprolol tartrate 25 mg p.o. twice daily, Lopressor 5 mg IV.  Patient also received sodium chloride  1 L bolus.  Assessment/Plan  Principal Problem:   SVT (supraventricular tachycardia) (HCC) Active Problems:   Anxiety   Essential hypertension   Diabetes mellitus type 2, noninsulin dependent (HCC)   Depression   Type 2 diabetes  mellitus with hyperlipidemia (HCC)   Obesity, diabetes, and hypertension syndrome (HCC)   Migraine   Neuropathy   # Supraventricular tachycardia with right upper quadrant abdominal pain-status post adenosine 6 mg IV once per EDP, Lopressor 5 mg IV, metoprolol tartrate 25 mg p.o. ordered per EDP - Complete echo ordered - Patient would benefit from stress test inpatient - A.m. team to consult cardiology pending complete echo - Admit to progressive cardiac, observation, telemetry  # Elevated troponin/NSTEMI-follow-up with second troponin - Heparin GTT  # Hypertension-resumed home lisinopril-hydrochlorothiazide 20-25 daily  # Leukocytosis # Chronic leukocytosis-query secondary to tobacco use disorder - Patient states she has never been evaluated for leukocytosis - Recommend outpatient follow-up with PCP for hematologist recommendation for evaluation of chronic leukocytosis  # Depression/anxiety-amitriptyline 50 mg nightly, duloxetine 60 mg daily, lorazepam 0.5 mg p.o. every 6 hours.  For anxiety  # Hyperlipidemia-atorvastatin 80 mg nightly  # Hyperglycemia # Non-insulin-dependent diabetes mellitus-resumed metformin 500 mg twice daily with meals - Insulin SSI with agents coverage ordered - Goal inpatient blood glucose level is 140-180  # History of migraine headaches-resumed home rizatriptan 10 mg p.o. as needed for migraines, Excedrin Migraine as needed  # Chronic back pain-resumed home Norco 5-325 mg tablet, p.o., every 6 hours as needed for moderate pain  # GERD-PPI  # Allergic rhinitis-Flonase  # Nicotine/tobacco - Greater than 3 minutes spent counseling patient on tobacco cessation - Patient endorses readiness to stop tobacco use and even has plans to quit by Christmas however I counseled patient on the following - Tobacco cessation counseling:  Week one, smoke 19 cigarettes per day. Week two, smoke 18 cigarettes per day. Week three, smoke 17 cigarettes per day, continue  until smoking half the amount of cigarettes per day Discuss with PCP for pharmacologic assistance with smoking cessation Clean all indoor clothing, sheets, blankets, and freshen textile furniture to rid the smell of cigarettes Only smoke outside and wear outer covering Leave cigarettes and lighters outside in separate places During in-between cigarettes, if you feel the urge to smoke, use the following: stress squeezing devices/phone a trusted friend to talk you through the urge/walk in a safe environment Avoid prolong interactions with individuals actively smoking cigarettes If you smoke in social setting, avoid social setting where cigarette smoking is expected or considered acceptable  If missing the feeling of holding a cigarette, cut a sipping straw to the length of a cigarette and hold it between your fingers  Chart reviewed.   DVT prophylaxis: Enoxaparin Code Status: Full code Diet: Heart healthy/carb modified Family Communication: No Disposition Plan: Pending clinical course Consults called: None at this time Admission status: Progressive cardiac, observation, telemetry  Past Medical History:  Diagnosis Date   Diabetes mellitus without complication ([place])    Hypertension    Mitral valve prolapse    Past Surgical History:  Procedure Laterality Date   COLONOSCOPY WITH PROPOFOL N/A 10/17/2017   Procedure: COLONOSCOPY WITH PROPOFOL;  Surgeon: [name], MD;  Location: [facility] ENDOSCOPY;  Service: Endoscopy;  Laterality: N/A;   LUMBAR FUSION  2005,2008, 2014, 2017   [place]   NASAL SEPTUM SURGERY     TONSILLECTOMY     TUBAL LIGATION  2001   Social History:  reports that  she has been smoking cigarettes. She has been smoking an average of 1 pack per day. She has never used smokeless tobacco. She reports current alcohol use. She reports that she does not use drugs.  Allergies  Allergen Reactions   Cephalexin Other (See Comments), Cough, Itching, Rash and Shortness  Of Breath   Other Shortness Of Breath and Swelling    "filler in the gabapentin" currently taking gabapentin from a different drug manufactory "Low reaction"    Clindamycin/Lincomycin Itching   Gabapentin    Prednisone Other (See Comments)    rage   Topiramate    Family History  Adopted: Yes  Problem Relation Age of Onset   Ovarian cancer Sister 1   Family history: Family history reviewed and not pertinent  Prior to Admission medications   Medication Sig Start Date End Date Taking? Authorizing Provider  albuterol (VENTOLIN HFA) 108 (90 Base) MCG/ACT inhaler Inhale 2 puffs into the lungs every 4 (four) hours as needed for wheezing or shortness of breath. 08/18/19  Yes Cuthriell, Charline Bills, PA-C  amitriptyline (ELAVIL) 50 MG tablet Take 50 mg by mouth at bedtime. 03/10/20  Yes [provider]  aspirin-acetaminophen-caffeine (EXCEDRIN MIGRAINE) [phone] MG tablet Take 2 tablets by mouth every 6 (six) hours as needed.   Yes [provider]  atorvastatin (LIPITOR) 80 MG tablet Take 80 mg by mouth daily. 08/21/21  Yes [provider]  baclofen (LIORESAL) 20 MG tablet Take 20 mg by mouth 3 (three) times daily as needed. 09/11/20  Yes [provider]  BIOTIN PO Take 1 capsule by mouth daily.   Yes [provider]  cetirizine (ZYRTEC) 10 MG tablet Take 10 mg by mouth daily.   Yes [provider]  DULoxetine (CYMBALTA) 60 MG capsule Take 60 mg by mouth daily. 01/31/21  Yes [provider]  fluticasone (FLONASE) 50 MCG/ACT nasal spray USE 1 SQUIRT IN EACH NOSTRIL DAILY 05/23/17  Yes [provider]  gabapentin (NEURONTIN) 600 MG tablet Take 600 mg by mouth 4 (four) times daily as needed. 10/02/21  Yes [provider]  HYDROcodone-acetaminophen (NORCO/VICODIN) 5-325 MG tablet Take 1 tablet by mouth every 6 (six) hours as needed. 5-325 mg 01/15/20  Yes [provider]  lisinopril-hydrochlorothiazide (ZESTORETIC)  20-25 MG tablet Take 1 tablet by mouth daily. 03/23/20  Yes [provider]  LORazepam (ATIVAN) 0.5 MG tablet Take 0.5 mg by mouth every 8 (eight) hours.   Yes [provider]  MECLIZINE HCL PO Take 25 mg by mouth as needed.   Yes [provider]  medroxyPROGESTERone (PROVERA) 10 MG tablet TAKE 2 TABLETS EVERY DAY 05/28/21  Yes Malachy Mood, MD  metFORMIN (GLUCOPHAGE-XR) 500 MG 24 hr tablet Take 500 mg by mouth in the morning and at bedtime. With meals 01/02/21  Yes [provider]  Multiple Vitamin (MULTIVITAMIN WITH MINERALS) TABS tablet Take 1 tablet by mouth daily.   Yes [provider]  pantoprazole (PROTONIX) 40 MG tablet Take 1 tablet by mouth daily. 10/13/20  Yes [provider]  rizatriptan (MAXALT-MLT) 10 MG disintegrating tablet Take 10 mg by mouth as needed. May repeat in 2 hrs if needed. Max dose 2 in 24 hrs 07/22/19  Yes [provider]  Semaglutide,0.25 or 0.5MG /DOS, (OZEMPIC, 0.25 OR 0.5 MG/DOSE,) 2 MG/1.5ML SOPN Inject 0.25mg  once a week Patient not taking: Reported on 10/20/2021 03/27/21   [provider]   Physical Exam: Vitals:   10/20/21 2215 10/20/21 2230 10/20/21 2245 10/20/21 2300  BP: (!) 93/54 (!) 93/55 91/64 (!) 88/73  Pulse: 87 85 82 92  Resp: 17 15 11 17   Temp:      TempSrc:      SpO2: 96% 96% 96% 95%  Weight:      Height:       Constitutional: appears age-appropriate, NAD, calm, comfortable Eyes: PERRL, lids and conjunctivae normal ENMT: Mucous membranes are moist. Posterior pharynx clear of any exudate or lesions. Age-appropriate dentition. Hearing appropriate Neck: normal, supple, no masses, no thyromegaly Respiratory: clear to auscultation bilaterally, no wheezing, no crackles. Normal respiratory effort. No accessory muscle use.  Cardiovascular: Regular rate and rhythm, no murmurs / rubs / gallops. No extremity edema. 2+ pedal pulses. No carotid bruits.  Abdomen: Obese abdomen, no  tenderness, no masses palpated, no hepatosplenomegaly. Bowel sounds positive.  Musculoskeletal: no clubbing / cyanosis. No joint deformity upper and lower extremities. Good ROM, no contractures, no atrophy. Normal muscle tone.  Skin: no rashes, lesions, ulcers. No induration Neurologic: Sensation intact. Strength 5/5 in all 4.  Psychiatric: Normal judgment and insight. Alert and oriented x 3. Normal mood.   EKG: independently reviewed, showing supraventricular tachycardia with rate of 186, QTc 418  Chest x-ray on Admission: I personally reviewed and I agree with radiologist reading as below.  DG Chest Portable 1 View  Result Date: 10/20/2021 CLINICAL DATA:  Dyspnea EXAM: PORTABLE CHEST 1 VIEW COMPARISON:  08/18/2019 FINDINGS: Lungs are well expanded, symmetric, and clear. No pneumothorax or pleural effusion. Cardiac size within normal limits. Pulmonary vascularity is normal. Osseous structures are age-appropriate. No acute bone abnormality. IMPRESSION: No active disease. Electronically Signed   By: Fidela Salisbury M.D.   On: 10/20/2021 21:47    Labs on Admission: I have personally reviewed following labs  CBC: Recent Labs  Lab 10/20/21 2113  WBC 21.6*  HGB 14.0  HCT 41.6  MCV 92.4  PLT 017*   Basic Metabolic Panel: Recent Labs  Lab 10/20/21 2113  NA 135  K 4.1  CL 103  CO2 23  GLUCOSE 186*  BUN 14  CREATININE 0.72  CALCIUM 9.8   GFR: Estimated Creatinine Clearance: 95.5 mL/min (by C-G formula based on SCr of 0.72 mg/dL).  Liver Function Tests: Recent Labs  Lab 10/20/21 2113  AST 29  ALT 37  ALKPHOS 80  BILITOT 0.7  PROT 7.6  ALBUMIN 3.6   Urine analysis:    Component Value Date/Time   COLORURINE AMBER (A) 05/26/2016 1026   APPEARANCEUR CLEAR (A) 05/26/2016 1026   LABSPEC 1.002 (L) 05/26/2016 1026   PHURINE 7.0 05/26/2016 1026   GLUCOSEU NEGATIVE 05/26/2016 1026   HGBUR 3+ (A) 05/26/2016 1026   BILIRUBINUR NEGATIVE 05/26/2016 1026   KETONESUR NEGATIVE  05/26/2016 1026   PROTEINUR 30 (A) 05/26/2016 1026   NITRITE NEGATIVE 05/26/2016 1026   LEUKOCYTESUR NEGATIVE 05/26/2016 1026   CRITICAL CARE Performed by: Briant Cedar Elpidio Thielen  Total critical care time: 35 minutes  Critical care time was exclusive of separately billable procedures and treating other patients.  Critical care was necessary to treat or prevent imminent or life-threatening deterioration.  Circulatory failure, NSTEMI, SVT  Critical care was time spent personally by me on the following activities: development of treatment plan with patient and/or surrogate as well as nursing, discussions with consultants, evaluation of patient's response to treatment, examination of patient, obtaining history from patient or surrogate, ordering and performing treatments and interventions, ordering and review of laboratory studies, ordering and review of radiographic studies, pulse oximetry and  re-evaluation of patient's condition.  Dr. Tobie Poet [facility]  If 7PM-7AM, please contact overnight-coverage provider If 7AM-7PM, please contact day coverage provider [email or web address]  10/20/2021, 11:26 PM

## 2021-10-20 NOTE — ED Triage Notes (Signed)
Pt arrived via POV with reports of elevated HR in the 170s and 180s for the past several hours, pt also c/o shortness of breath, cp and neck pain. Pt states she feels like she is having a heart attack.  Pt alert and oriented, pt was taken back to obtain EKG after stat registration. Vagal maneuvers in triage did not convert. Dr. Quentin Cornwall aware.

## 2021-10-21 ENCOUNTER — Observation Stay
Admit: 2021-10-21 | Discharge: 2021-10-21 | Disposition: A | Discharge status: Home / Self Care | Payer: Medicare HMO | Attending: Internal Medicine | Admitting: Internal Medicine

## 2021-10-21 DIAGNOSIS — I471 Supraventricular tachycardia: Secondary | ICD-10-CM | POA: Diagnosis not present

## 2021-10-21 LAB — APTT: aPTT: 37 seconds — ABNORMAL HIGH (ref 24–36)

## 2021-10-21 LAB — CBG MONITORING, ED
Glucose-Capillary: 134 mg/dL — ABNORMAL HIGH (ref 70–99)
Glucose-Capillary: 168 mg/dL — ABNORMAL HIGH (ref 70–99)

## 2021-10-21 LAB — HIV ANTIBODY (ROUTINE TESTING W REFLEX): HIV Screen 4th Generation wRfx: NONREACTIVE

## 2021-10-21 LAB — CBC
HCT: 36.1 % (ref 36.0–46.0)
Hemoglobin: 11.9 g/dL — ABNORMAL LOW (ref 12.0–15.0)
MCH: 30.6 pg (ref 26.0–34.0)
MCHC: 33 g/dL (ref 30.0–36.0)
MCV: 92.8 fL (ref 80.0–100.0)
Platelets: 338 10*3/uL (ref 150–400)
RBC: 3.89 MIL/uL (ref 3.87–5.11)
RDW: 13.4 % (ref 11.5–15.5)
WBC: 16.2 10*3/uL — ABNORMAL HIGH (ref 4.0–10.5)
nRBC: 0 % (ref 0.0–0.2)

## 2021-10-21 LAB — ECHOCARDIOGRAM COMPLETE
AR max vel: 2.2 cm2
AV Peak grad: 9.9 mmHg
Ao pk vel: 1.57 m/s
Area-P 1/2: 2.93 cm2
Height: 63 in
S' Lateral: 3.3 cm
Weight: 3568 oz

## 2021-10-21 LAB — PHOSPHORUS: Phosphorus: 4.6 mg/dL (ref 2.5–4.6)

## 2021-10-21 LAB — PROCALCITONIN: Procalcitonin: <0.1 ng/mL

## 2021-10-21 LAB — MAGNESIUM: Magnesium: 1.9 mg/dL (ref 1.7–2.4)

## 2021-10-21 LAB — HEPARIN LEVEL (UNFRACTIONATED): Heparin Unfractionated: 0.1 IU/mL — ABNORMAL LOW (ref 0.30–0.70)

## 2021-10-21 LAB — BASIC METABOLIC PANEL
Anion gap: 8 (ref 5–15)
BUN: 14 mg/dL (ref 6–20)
CO2: 22 mmol/L (ref 22–32)
Calcium: 9.1 mg/dL (ref 8.9–10.3)
Chloride: 105 mmol/L (ref 98–111)
Creatinine, Ser: 0.63 mg/dL (ref 0.44–1.00)
GFR, Estimated: >60 mL/min (ref >60)
Glucose, Bld: 148 mg/dL — ABNORMAL HIGH (ref 70–99)
Potassium: 4 mmol/L (ref 3.5–5.1)
Sodium: 135 mmol/L (ref 135–145)

## 2021-10-21 LAB — PROTIME-INR
INR: 1 (ref 0.8–1.2)
Prothrombin Time: 13.2 seconds (ref 11.4–15.2)

## 2021-10-21 LAB — BRAIN NATRIURETIC PEPTIDE: B Natriuretic Peptide: 86.4 pg/mL (ref 0.0–100.0)

## 2021-10-21 LAB — TROPONIN I (HIGH SENSITIVITY)
Troponin I (High Sensitivity): 226 ng/L (ref <18)
Troponin I (High Sensitivity): 89 ng/L — ABNORMAL HIGH (ref <18)

## 2021-10-21 LAB — TSH: TSH: 3.478 u[IU]/mL (ref 0.350–4.500)

## 2021-10-21 MED ORDER — METOPROLOL SUCCINATE ER 50 MG PO TB24
25.0000 mg | ORAL_TABLET | Freq: Every day | ORAL | Status: DC
  Administered 2021-10-21: 12:00:00 25 mg via ORAL
  Filled 2021-10-21: qty 1

## 2021-10-21 MED ORDER — LISINOPRIL 10 MG PO TABS
20.0000 mg | ORAL_TABLET | Freq: Every day | ORAL | Status: DC
  Administered 2021-10-21: 09:00:00 20 mg via ORAL
  Filled 2021-10-21: qty 2

## 2021-10-21 MED ORDER — HEPARIN BOLUS VIA INFUSION
2300.0000 [IU] | Freq: Once | INTRAVENOUS | Status: AC
  Administered 2021-10-21: 13:00:00 2300 [IU] via INTRAVENOUS
  Filled 2021-10-21: qty 2300

## 2021-10-21 MED ORDER — ALBUTEROL SULFATE (2.5 MG/3ML) 0.083% IN NEBU: 2.5000 mg | INHALATION_SOLUTION | RESPIRATORY_TRACT | Status: DC | PRN

## 2021-10-21 MED ORDER — HEPARIN (PORCINE) 25000 UT/250ML-% IV SOLN
1300.0000 [IU]/h | INTRAVENOUS | Status: DC
Start: 2021-10-21 — End: 2021-10-21
  Administered 2021-10-21: 05:00:00 1000 [IU]/h via INTRAVENOUS
  Filled 2021-10-21: qty 250

## 2021-10-21 MED ORDER — HEPARIN BOLUS VIA INFUSION
4000.0000 [IU] | Freq: Once | INTRAVENOUS | Status: AC
  Administered 2021-10-21: 05:00:00 4000 [IU] via INTRAVENOUS
  Filled 2021-10-21: qty 4000

## 2021-10-21 MED ORDER — METOPROLOL TARTRATE 25 MG PO TABS
25.0000 mg | ORAL_TABLET | Freq: Two times a day (BID) | ORAL | Status: DC
Start: 2021-10-21 — End: 2021-10-21

## 2021-10-21 MED ORDER — METOPROLOL SUCCINATE ER 25 MG PO TB24: 25.0000 mg | ORAL_TABLET | Freq: Every day | ORAL | 0 refills | Status: DC

## 2021-10-21 MED ORDER — HYDROCHLOROTHIAZIDE 25 MG PO TABS
25.0000 mg | ORAL_TABLET | Freq: Every day | ORAL | Status: DC
  Administered 2021-10-21: 09:00:00 25 mg via ORAL
  Filled 2021-10-21: qty 1

## 2021-10-21 NOTE — Progress Notes (Signed)
[place] for heparin consult Indication: nstemi and svt  Allergies  Allergen Reactions   Cephalexin Other (See Comments), Cough, Itching, Rash and Shortness Of Breath   Other Shortness Of Breath and Swelling    "filler in the gabapentin" currently taking gabapentin from a different drug manufactory "Low reaction"    Clindamycin/Lincomycin Itching   Gabapentin    Prednisone Other (See Comments)    rage   Topiramate     Patient Measurements: Height: 5\' 3"  (160 cm) Weight: 101.2 kg (223 lb) IBW/kg (Calculated) : 52.4 Heparin Dosing Weight: 76.2 kg  Vital Signs: Temp: 98.2 F (36.8 C) (12/17 2200) Temp Source: Oral (12/17 2108) BP: 88/61 (12/18 0000) Pulse Rate: 81 (12/18 0000)  Labs: Recent Labs    10/20/21 2113 10/21/21 0035  HGB 14.0  --   HCT 41.6  --   PLT 445*  --   CREATININE 0.72  --   TROPONINIHS 93* 226*    Estimated Creatinine Clearance: 95.5 mL/min (by C-G formula based on SCr of 0.72 mg/dL).   Medical History: Past Medical History:  Diagnosis Date   Diabetes mellitus without complication ([place])    Hypertension    Mitral valve prolapse    Obesity (BMI 30-39.9)    Assessment: Pt is 50 yo female presenting to ED c/o elevated heart rate, found with elevated troponin I, trending up.  Goal of Therapy:  Heparin level 0.3-0.7 units/ml Monitor platelets by anticoagulation protocol: Yes   Plan:  Bolus 4000 units x 1 Start heparin infusion at 1000 units/hr Check HL in 6 hrs after start of infusion CBC daily while on heparin.  Renda Rolls, PharmD, MBA 10/21/2021 1:45 AM

## 2021-10-21 NOTE — ED Notes (Signed)
Rn to bedside. Pt CAOx4 and in no distress. Sitting up on the edge of the bed.

## 2021-10-21 NOTE — Discharge Summary (Signed)
[name] [place] [facility] NAME: Leslie Zamora    MR#:  [id number]  DATE OF BIRTH:  Oct 27, 1971  DATE OF ADMISSION:  10/20/2021 ADMITTING PHYSICIAN: Amy N Cox, DO  DATE OF DISCHARGE: 10/21/2021  PRIMARY CARE PHYSICIAN: Nicholes Rough, PA-C    ADMISSION DIAGNOSIS:  SVT (supraventricular tachycardia) (HCC) [I47.1]  DISCHARGE DIAGNOSIS:  SVT--now in NSR  SECONDARY DIAGNOSIS:   Past Medical History:  Diagnosis Date   Diabetes mellitus without complication ([place])    Hypertension    Mitral valve prolapse    Obesity (BMI 30-39.9)     HOSPITAL COURSE:  Leslie Zamora is a 50 y.o. female with medical history significant for tobacco use, hypertension, mitral valve prolapse, non-insulin-dependent diabetes mellitus, anxiety, migraine headaches, hyperlipidemia, depression, neuropathy, who presents emergency department for chief concerns of elevated heart rate.  SVT -- patient came in with elevated heart rate. She received adenosine, IV metoprolol, PO metoprolol -- currently in sinus rhythm -- remains in sinus rhythm throughout the night. -- Patient overall feels better. Requesting to go home. -- Patient overall hemodynamically stable. I have advised her to follow-up with Dr. Clista Bernhardt cardiology with [facility] regarding her SVT. Patient and husband voiced understanding. -- She is recommended to return the emergency room if her symptoms repair -- advised to cut back on caffeine  Elevated troponin suspect demand ischemia in the setting of SVT -- patient was empirically started on heparin drip -- no chest pain no acute STT changes -- troponin trending down  Hypertension -- continue lisinopril/hydrochlorothiazide  Depression anxiety -- continue home meds  tobacco abuse -- advised smoking cessation  discharged home with outpatient follow-up cardiology. Patient and family agreeable  CONSULTS OBTAINED:    DRUG ALLERGIES:    Allergies  Allergen Reactions   Cephalexin Other (See Comments), Cough, Itching, Rash and Shortness Of Breath   Other Shortness Of Breath and Swelling    "filler in the gabapentin" currently taking gabapentin from a different drug manufactory "Low reaction"    Clindamycin/Lincomycin Itching   Gabapentin    Prednisone Other (See Comments)    rage   Topiramate     DISCHARGE MEDICATIONS:   Allergies as of 10/21/2021       Reactions   Cephalexin Other (See Comments), Cough, Itching, Rash, Shortness Of Breath   Other Shortness Of Breath, Swelling   "filler in the gabapentin" currently taking gabapentin from a different drug manufactory "Low reaction"    Clindamycin/lincomycin Itching   Gabapentin    Prednisone Other (See Comments)   rage   Topiramate         Medication List     STOP taking these medications    Ozempic (0.25 or 0.5 MG/DOSE) 2 MG/1.5ML Sopn Generic drug: Semaglutide(0.25 or 0.5MG /DOS)       TAKE these medications    albuterol 108 (90 Base) MCG/ACT inhaler Commonly known as: VENTOLIN HFA Inhale 2 puffs into the lungs every 4 (four) hours as needed for wheezing or shortness of breath.   amitriptyline 50 MG tablet Commonly known as: ELAVIL Take 50 mg by mouth at bedtime.   aspirin-acetaminophen-caffeine 250-250-65 MG tablet Commonly known as: EXCEDRIN MIGRAINE Take 2 tablets by mouth every 6 (six) hours as needed.   atorvastatin 80 MG tablet Commonly known as: LIPITOR Take 80 mg by mouth daily.   baclofen 20 MG tablet Commonly known as: LIORESAL Take 20 mg by mouth 3 (three) times daily as needed.  BIOTIN PO Take 1 capsule by mouth daily.   cetirizine 10 MG tablet Commonly known as: ZYRTEC Take 10 mg by mouth daily.   DULoxetine 60 MG capsule Commonly known as: CYMBALTA Take 60 mg by mouth daily.   fluticasone 50 MCG/ACT nasal spray Commonly known as: FLONASE USE 1 SQUIRT IN EACH NOSTRIL DAILY   gabapentin 600 MG  tablet Commonly known as: NEURONTIN Take 600 mg by mouth 4 (four) times daily as needed.   HYDROcodone-acetaminophen 5-325 MG tablet Commonly known as: NORCO/VICODIN Take 1 tablet by mouth every 6 (six) hours as needed. 5-325 mg   lisinopril-hydrochlorothiazide 20-25 MG tablet Commonly known as: ZESTORETIC Take 1 tablet by mouth daily.   LORazepam 0.5 MG tablet Commonly known as: ATIVAN Take 0.5 mg by mouth every 8 (eight) hours.   MECLIZINE HCL PO Take 25 mg by mouth as needed.   medroxyPROGESTERone 10 MG tablet Commonly known as: PROVERA TAKE 2 TABLETS EVERY DAY   metFORMIN 500 MG 24 hr tablet Commonly known as: GLUCOPHAGE-XR Take 500 mg by mouth in the morning and at bedtime. With meals   metoprolol succinate 25 MG 24 hr tablet Commonly known as: TOPROL-XL Take 1 tablet (25 mg total) by mouth daily. Take with or immediately following a meal. Start taking on: October 22, 2021   multivitamin with minerals Tabs tablet Take 1 tablet by mouth daily.   pantoprazole 40 MG tablet Commonly known as: PROTONIX Take 1 tablet by mouth daily.   rizatriptan 10 MG disintegrating tablet Commonly known as: MAXALT-MLT Take 10 mg by mouth as needed. May repeat in 2 hrs if needed. Max dose 2 in 24 hrs        If you experience worsening of your admission symptoms, develop shortness of breath, life threatening emergency, suicidal or homicidal thoughts you must seek medical attention immediately by calling 911 or calling your MD immediately  if symptoms less severe.  You Must read complete instructions/literature along with all the possible adverse reactions/side effects for all the Medicines you take and that have been prescribed to you. Take any new Medicines after you have completely understood and accept all the possible adverse reactions/side effects.   Please note  You were cared for by a hospitalist during your hospital stay. If you have any questions about your discharge  medications or the care you received while you were in the hospital after you are discharged, you can call the unit and asked to speak with the hospitalist on call if the hospitalist that took care of you is not available. Once you are discharged, your primary care physician will handle any further medical issues. Please note that NO REFILLS for any discharge medications will be authorized once you are discharged, as it is imperative that you return to your primary care physician (or establish a relationship with a primary care physician if you do not have one) for your aftercare needs so that they can reassess your need for medications and monitor your lab values. Today   SUBJECTIVE   per patient I feel a lot better. Would like to go home. No chest pain or shortness of breath  VITAL SIGNS:  Blood pressure 115/60, pulse 79, temperature 98.2 F (36.8 C), resp. rate 16, height 5\' 3"  (1.6 m), weight 101.2 kg, SpO2 99 %.  I/O:   Intake/Output Summary (Last 24 hours) at 10/21/2021 1324 Last data filed at 10/20/2021 2312 Gross per 24 hour  Intake 1000 ml  Output --  Net 1000  ml    PHYSICAL EXAMINATION:  GENERAL:  50 y.o.-year-old patient lying in the bed with no acute distress.  LUNGS: Normal breath sounds bilaterally, no wheezing, rales,rhonchi or crepitation. No use of accessory muscles of respiration.  CARDIOVASCULAR: S1, S2 normal. No murmurs, rubs, or gallops.  ABDOMEN: Soft, non-tender, non-distended. Bowel sounds present. No organomegaly or mass.  EXTREMITIES: No pedal edema, cyanosis, or clubbing.  NEUROLOGIC: non-focal PSYCHIATRIC:  patient is alert and oriented x 3.  SKIN: No obvious rash, lesion, or ulcer.   DATA REVIEW:   CBC  Recent Labs  Lab 10/21/21 0400  WBC 16.2*  HGB 11.9*  HCT 36.1  PLT 338    Chemistries  Recent Labs  Lab 10/20/21 2113 10/21/21 0035 10/21/21 0400  NA 135  --  135  K 4.1  --  4.0  CL 103  --  105  CO2 23  --  22  GLUCOSE 186*  --   148*  BUN 14  --  14  CREATININE 0.72  --  0.63  CALCIUM 9.8  --  9.1  MG  --  1.9  --   AST 29  --   --   ALT 37  --   --   ALKPHOS 80  --   --   BILITOT 0.7  --   --     Microbiology Results   Recent Results (from the past 240 hour(s))  Resp Panel by RT-PCR (Flu A&B, Covid) Nasopharyngeal Swab     Status: None   Collection Time: 10/20/21  9:58 PM   Specimen: Nasopharyngeal Swab; Nasopharyngeal(NP) swabs in vial transport medium  Result Value Ref Range Status   SARS Coronavirus 2 by RT PCR NEGATIVE NEGATIVE Final    Comment: (NOTE) SARS-CoV-2 target nucleic acids are NOT DETECTED.  The SARS-CoV-2 RNA is generally detectable in upper respiratory specimens during the acute phase of infection. The lowest concentration of SARS-CoV-2 viral copies this assay can detect is 138 copies/mL. A negative result does not preclude SARS-Cov-2 infection and should not be used as the sole basis for treatment or other patient management decisions. A negative result may occur with  improper specimen collection/handling, submission of specimen other than nasopharyngeal swab, presence of viral mutation(s) within the areas targeted by this assay, and inadequate number of viral copies(<138 copies/mL). A negative result must be combined with clinical observations, patient history, and epidemiological information. The expected result is Negative.  Fact Sheet for Patients:  EntrepreneurPulse.com.au  Fact Sheet for Healthcare Providers:  IncredibleEmployment.be  This test is no t yet approved or cleared by the [place] FDA and  has been authorized for detection and/or diagnosis of SARS-CoV-2 by FDA under an Emergency Use Authorization (EUA). This EUA will remain  in effect (meaning this test can be used) for the duration of the COVID-19 declaration under Section 564(b)(1) of the Act, 21 U.S.C.section 360bbb-3(b)(1), unless the authorization is terminated   or revoked sooner.       Influenza A by PCR NEGATIVE NEGATIVE Final   Influenza B by PCR NEGATIVE NEGATIVE Final    Comment: (NOTE) The Xpert Xpress SARS-CoV-2/FLU/RSV plus assay is intended as an aid in the diagnosis of influenza from Nasopharyngeal swab specimens and should not be used as a sole basis for treatment. Nasal washings and aspirates are unacceptable for Xpert Xpress SARS-CoV-2/FLU/RSV testing.  Fact Sheet for Patients: EntrepreneurPulse.com.au  Fact Sheet for Healthcare Providers: IncredibleEmployment.be  This test is not yet approved or cleared by the [place]  States FDA and has been authorized for detection and/or diagnosis of SARS-CoV-2 by FDA under an Emergency Use Authorization (EUA). This EUA will remain in effect (meaning this test can be used) for the duration of the COVID-19 declaration under Section 564(b)(1) of the Act, 21 U.S.C. section 360bbb-3(b)(1), unless the authorization is terminated or revoked.  Performed at [facility], [address] [place], [place] [address]     RADIOLOGY:  DG Chest Portable 1 View  Result Date: 10/20/2021 CLINICAL DATA:  Dyspnea EXAM: PORTABLE CHEST 1 VIEW COMPARISON:  08/18/2019 FINDINGS: Lungs are well expanded, symmetric, and clear. No pneumothorax or pleural effusion. Cardiac size within normal limits. Pulmonary vascularity is normal. Osseous structures are age-appropriate. No acute bone abnormality. IMPRESSION: No active disease. Electronically Signed   By: Fidela Salisbury M.D.   On: 10/20/2021 21:47     CODE STATUS:     Code Status Orders  (From admission, onward)           Start     Ordered   10/20/21 2316  Full code  Continuous        10/20/21 2317           Code Status History     This patient has a current code status but no historical code status.        TOTAL TIME TAKING CARE OF THIS PATIENT: 40 minutes.    Fritzi Mandes M.D  [facility]    CC: Primary care physician; Nicholes Rough, PA-C

## 2021-10-21 NOTE — ED Notes (Signed)
Patient updated on plan of care.  Was unaware that she was being admitted to hospital for observation.

## 2021-10-21 NOTE — Progress Notes (Signed)
*  PRELIMINARY RESULTS* Echocardiogram 2D Echocardiogram has been performed.  Leslie Zamora 10/21/2021, 11:33 AM

## 2021-10-21 NOTE — Progress Notes (Signed)
[facility] for heparin consult Indication: nstemi and svt  Allergies  Allergen Reactions   Cephalexin Other (See Comments), Cough, Itching, Rash and Shortness Of Breath   Other Shortness Of Breath and Swelling    "filler in the gabapentin" currently taking gabapentin from a different drug manufactory "Low reaction"    Clindamycin/Lincomycin Itching   Gabapentin    Prednisone Other (See Comments)    rage   Topiramate     Patient Measurements: Height: 5\' 3"  (160 cm) Weight: 101.2 kg (223 lb) IBW/kg (Calculated) : 52.4 Heparin Dosing Weight: 76.2 kg  Vital Signs: BP: 115/60 (12/18 1200) Pulse Rate: 79 (12/18 1200)  Labs: Recent Labs    10/20/21 2113 10/21/21 0035 10/21/21 0400 10/21/21 1107  HGB 14.0  --  11.9*  --   HCT 41.6  --  36.1  --   PLT 445*  --  338  --   APTT  --   --  37*  --   LABPROT  --   --  13.2  --   INR  --   --  1.0  --   HEPARINUNFRC  --   --   --  0.10*  CREATININE 0.72  --  0.63  --   TROPONINIHS 93* 226*  --   --      Estimated Creatinine Clearance: 95.5 mL/min (by C-G formula based on SCr of 0.63 mg/dL).   Medical History: Past Medical History:  Diagnosis Date   Diabetes mellitus without complication ([place])    Hypertension    Mitral valve prolapse    Obesity (BMI 30-39.9)    Assessment: Pt is 50 yo female presenting to ED c/o elevated heart rate, found with elevated troponin I, trending up.  12/18 1107 HL 0.1   Goal of Therapy:  Heparin level 0.3-0.7 units/ml Monitor platelets by anticoagulation protocol: Yes   Plan:  Heparin level is subtherapeutic. Will give heparin bolus 2300 units x 1 and increase heparin infusion to 1300 units/hr. Recheck heparin level in 6 hours. CBC daily while on heparin.    Eleonore Chiquito,  PharmD 10/21/2021 12:39 PM

## 2021-10-21 NOTE — Discharge Instructions (Signed)
Avoid Caffeinated drinks

## 2021-10-22 LAB — HEMOGLOBIN A1C
Hgb A1c MFr Bld: 6.9 % — ABNORMAL HIGH (ref 4.8–5.6)
Mean Plasma Glucose: 151 mg/dL

## 2021-11-17 ENCOUNTER — Encounter: Payer: Self-pay | Admitting: Obstetrics and Gynecology

## 2021-11-28 ENCOUNTER — Other Ambulatory Visit: Payer: Self-pay | Admitting: Obstetrics and Gynecology

## 2022-01-07 ENCOUNTER — Other Ambulatory Visit: Payer: Self-pay | Admitting: Obstetrics & Gynecology

## 2022-01-07 ENCOUNTER — Telehealth: Payer: Self-pay

## 2022-01-07 MED ORDER — MEDROXYPROGESTERONE ACETATE 10 MG PO TABS
20.0000 mg | ORAL_TABLET | Freq: Every day | ORAL | 1 refills | Status: DC
Start: 2022-01-07 — End: 2022-07-05

## 2022-01-07 NOTE — Telephone Encounter (Signed)
Pt aware and appreciative  

## 2022-01-07 NOTE — Telephone Encounter (Signed)
OK 

## 2022-01-07 NOTE — Telephone Encounter (Signed)
Pt calling; has been out of medroxyprogesterone for 4-5 days now; pharm has sent request but no response; please refill.  [phone] ?

## 2022-03-01 ENCOUNTER — Emergency Department
Admission: EM | Admit: 2022-03-01 | Discharge: 2022-03-01 | Disposition: A | Discharge status: Home / Self Care | Payer: Medicare HMO | Attending: Emergency Medicine | Admitting: Emergency Medicine

## 2022-03-01 ENCOUNTER — Other Ambulatory Visit: Payer: Self-pay

## 2022-03-01 DIAGNOSIS — M25511 Pain in right shoulder: Secondary | ICD-10-CM | POA: Insufficient documentation

## 2022-03-01 DIAGNOSIS — M5412 Radiculopathy, cervical region: Secondary | ICD-10-CM | POA: Diagnosis not present

## 2022-03-01 DIAGNOSIS — M791 Myalgia, unspecified site: Secondary | ICD-10-CM | POA: Diagnosis not present

## 2022-03-01 DIAGNOSIS — M542 Cervicalgia: Secondary | ICD-10-CM | POA: Diagnosis present

## 2022-03-01 MED ORDER — OXYCODONE-ACETAMINOPHEN 5-325 MG PO TABS: 1.0000 | ORAL_TABLET | Freq: Three times a day (TID) | ORAL | 0 refills | Status: DC | PRN

## 2022-03-01 MED ORDER — DEXAMETHASONE SODIUM PHOSPHATE 10 MG/ML IJ SOLN
10.0000 mg | Freq: Once | INTRAMUSCULAR | Status: AC
Start: 2022-03-01 — End: 2022-03-01
  Administered 2022-03-01: 10 mg via INTRAMUSCULAR
  Filled 2022-03-01: qty 1

## 2022-03-01 MED ORDER — KETOROLAC TROMETHAMINE 30 MG/ML IJ SOLN
30.0000 mg | Freq: Once | INTRAMUSCULAR | Status: AC
  Administered 2022-03-01: 30 mg via INTRAMUSCULAR
  Filled 2022-03-01: qty 1

## 2022-03-01 MED ORDER — NAPROXEN 500 MG PO TABS
500.0000 mg | ORAL_TABLET | Freq: Two times a day (BID) | ORAL | 2 refills | Status: AC
Start: 2022-03-01 — End: ?

## 2022-03-01 NOTE — ED Provider Notes (Signed)
? ?[facility] ?Provider Note ? ? ? Event Date/Time  ? First MD Initiated Contact with Patient 03/01/22 0840   ?  (approximate) ? ? ?History  ? ?Neck Pain ? ? ?HPI ? ?Leslie Zamora is a 51 y.o. female with extensive past medical history including degenerative disc disease presents with complaints of right shoulder pain.  Patient reports she has had MRI in the past that is been consistent with slipped disc, she has had radiculopathy for some time.  She does follow with a spine surgeon.  Reports pain along her right shoulder that radiates from her neck.  Normal strength in her right upper extremity.  No numbness ?  ? ? ?Physical Exam  ? ?Triage Vital Signs: ?ED Triage Vitals  ?Enc Vitals Group  ?   BP 03/01/22 0822 (!) 151/84  ?   Pulse Rate 03/01/22 0822 (!) 104  ?   Resp 03/01/22 0822 17  ?   Temp 03/01/22 0822 99.5 ?F (37.5 ?C)  ?   Temp Source 03/01/22 0822 Oral  ?   SpO2 03/01/22 0822 98 %  ?   Weight 03/01/22 0816 101.6 kg (224 lb)  ?   Height 03/01/22 0816 1.626 m ('5\' 4"'$ )  ?   Head Circumference --   ?   Peak Flow --   ?   Pain Score 03/01/22 0816 5  ?   Pain Loc --   ?   Pain Edu? --   ?   Excl. in [place]? --   ? ? ?Most recent vital signs: ?Vitals:  ? 03/01/22 0822  ?BP: (!) 151/84  ?Pulse: (!) 104  ?Resp: 17  ?Temp: 99.5 ?F (37.5 ?C)  ?SpO2: 98%  ? ? ? ?General: Awake, no distress.  ?CV:  Good peripheral perfusion.  ?Resp:  Normal effort.  ?Abd:  No distention.  ?Other:  Pain with abduction above 90 degrees, no rash or swelling or redness but no bony abnormalities, warm and well-perfused ? ? ?ED Results / Procedures / Treatments  ? ?Labs ?(all labs ordered are listed, but only abnormal results are displayed) ?Labs Reviewed - No data to display ? ? ?EKG ? ? ? ? ?RADIOLOGY ? ? ? ? ?PROCEDURES: ? ?Critical Care performed:  ? ?Procedures ? ? ?MEDICATIONS ORDERED IN ED: ?Medications  ?dexamethasone (DECADRON) injection 10 mg (10 mg Intramuscular Given 03/01/22 0905)  ?ketorolac (TORADOL) 30  MG/ML injection 30 mg (30 mg Intramuscular Given 03/01/22 0902)  ? ? ? ?IMPRESSION / MDM / ASSESSMENT AND PLAN / ED COURSE  ?I reviewed the triage vital signs and the nursing notes. ? ?Patient presents with musculoskeletal pain as above, suspicious for radiculopathy.  Discussed with patient steroids, she says she cannot take p.o. steroids but does well with IM steroids.  Will give IM Decadron, IM Toradol, recommend NSAID and will do brief course of narcotics.  Recommend close follow-up with her orthopedic surgeon. ? ? ? ? ? ?  ? ? ?FINAL CLINICAL IMPRESSION(S) / ED DIAGNOSES  ? ?Final diagnoses:  ?Cervical radiculopathy  ? ? ? ?Rx / DC Orders  ? ?ED Discharge Orders   ? ?      Ordered  ?  oxyCODONE-acetaminophen (PERCOCET) 5-325 MG tablet  Every 8 hours PRN       ? 03/01/22 0901  ?  naproxen (NAPROSYN) 500 MG tablet  2 times daily with meals       ? 03/01/22 0901  ? ?  ?  ? ?  ? ? ? ?  Note:  This document was prepared using Dragon voice recognition software and may include unintentional dictation errors. ?  ?Lavonia Drafts, MD ?03/01/22 1532 ? ?

## 2022-03-01 NOTE — ED Triage Notes (Addendum)
Pt states she has a known bulging disk in her neck and since Wednesday having pain radiating in to fingers and is unable to lift her right upper arm.Marland Kitchen  ?

## 2022-05-02 ENCOUNTER — Ambulatory Visit: Payer: Medicare HMO | Admitting: Obstetrics and Gynecology

## 2022-05-14 ENCOUNTER — Ambulatory Visit (INDEPENDENT_AMBULATORY_CARE_PROVIDER_SITE_OTHER): Payer: Medicare HMO | Admitting: Obstetrics & Gynecology

## 2022-05-14 ENCOUNTER — Encounter: Payer: Self-pay | Admitting: Obstetrics & Gynecology

## 2022-05-14 VITALS — BP 106/68 | Ht 63.0 in | Wt 222.0 lb

## 2022-05-14 DIAGNOSIS — Z1231 Encounter for screening mammogram for malignant neoplasm of breast: Secondary | ICD-10-CM

## 2022-05-14 DIAGNOSIS — R232 Flushing: Secondary | ICD-10-CM

## 2022-05-14 DIAGNOSIS — R6882 Decreased libido: Secondary | ICD-10-CM | POA: Diagnosis not present

## 2022-05-14 DIAGNOSIS — Z01419 Encounter for gynecological examination (general) (routine) without abnormal findings: Secondary | ICD-10-CM | POA: Diagnosis not present

## 2022-05-14 DIAGNOSIS — R6889 Other general symptoms and signs: Secondary | ICD-10-CM

## 2022-05-14 NOTE — Progress Notes (Addendum)
Subjective:    Leslie Zamora is a 51 y.o. married P2 who presents for an annual exam. The patient has multiple complaints today, including hot flashes, decreased libido, cold intolerance. The patient is not currently sexually active. GYN screening history: last pap: was normal. The patient wears seatbelts: yes. The patient participates in regular exercise: yes. Has the patient ever been transfused or tattooed?: yes. The patient reports that there is not domestic violence in her life.   Menstrual History: OB History     Gravida  5   Para  2   Term      Preterm      AB  3   Living  2      SAB  3   IAB      Ectopic      Multiple      Live Births              Menarche age: 52 No LMP recorded. (Menstrual status: Other).    The following portions of the patient's history were reviewed and updated as appropriate: allergies, current medications, past family history, past medical history, past social history, past surgical history, and problem list.  Review of Systems Pertinent items are noted in HPI.    Objective:    BP 106/68   Ht '5\' 3"'$  (1.6 m)   Wt 222 lb (100.7 kg)   BMI 39.33 kg/m   General Appearance:    Alert, cooperative, no distress, appears stated age  Head:    Normocephalic, without obvious abnormality, atraumatic  Eyes:    PERRL, conjunctiva/corneas clear, EOM's intact, fundi    benign, both eyes  Ears:    Normal TM's and external ear canals, both ears  Nose:   Nares normal, septum midline, mucosa normal, no drainage    or sinus tenderness  Throat:   Lips, mucosa, and tongue normal; teeth and gums normal  Neck:   Supple, symmetrical, trachea midline, no adenopathy;    thyroid:  no enlargement/tenderness/nodules; no carotid   bruit or JVD  Back:     Symmetric, no curvature, ROM normal, no CVA tenderness  Lungs:     Clear to auscultation bilaterally, respirations unlabored  Chest Wall:    No tenderness or deformity   Heart:    Regular rate and  rhythm, S1 and S2 normal, no murmur, rub   or gallop  Breast Exam:    No tenderness, masses, or nipple abnormality  Abdomen:     Soft, non-tender, bowel sounds active all four quadrants,    no masses, no organomegaly  Genitalia:    Genitourinary:             External: Normal external female genitalia.  Normal urethral meatus, normal Bartholin's and Skene's glands.               Vagina: Normal vaginal mucosa, no evidence of prolapse.                        Uterus: Non-enlarged, mobile, normal contour.  No CMT             Adnexa: ovaries non-enlarged, no adnexal masses             Rectal: deferred      Extremities:   Extremities normal, atraumatic, no cyanosis or edema  Pulses:   2+ and symmetric all extremities  Skin:   Skin color, texture, turgor normal, no rashes or lesions  Lymph nodes:  Cervical, supraclavicular, and axillary nodes normal  Neurologic:   CNII-XII intact, normal strength, sensation and reflexes    throughout  .    Assessment:    Healthy female exam.  Cold intolerance Hot flashes Decreased libido. Come back in a week to discuss results/treatments   Plan:     Await pap smear results. labs  Mammogram ordered

## 2022-05-15 LAB — TSH+FREE T4
Free T4: 1.32 ng/dL (ref 0.82–1.77)
TSH: 1.64 u[IU]/mL (ref 0.450–4.500)

## 2022-05-15 LAB — FOLLICLE STIMULATING HORMONE: FSH: 23 m[IU]/mL

## 2022-05-20 ENCOUNTER — Encounter: Payer: Self-pay | Admitting: Obstetrics

## 2022-05-21 ENCOUNTER — Telehealth: Payer: Self-pay | Admitting: Obstetrics & Gynecology

## 2022-05-21 NOTE — Telephone Encounter (Signed)
Contacted pt about the discussion of her lab results and symptoms.  A telephone visit will need to be scheduled with Dr. Hulan Fray that the results and symptoms can be discussed. Discussion of how to proceed may be needed.

## 2022-05-23 ENCOUNTER — Ambulatory Visit: Payer: Medicare HMO | Admitting: Obstetrics

## 2022-05-23 NOTE — Telephone Encounter (Signed)
RN to let pt know labs are normal but up to Dr. Hulan Fray to manage. Forwarding msg to Dr. Hulan Fray to see if she will call pt vs need to be seen in office.

## 2022-05-23 NOTE — Telephone Encounter (Signed)
Patient called in and states she refuses to see Dr Hulan Fray. Patient states she didn't feel heard and would like someone else to go over here results.I have advise patient she would need to come in for a face  to face appointment for another provider to review. Patient is scheduled for 05/24/22 at 4:15 pm with JEG.

## 2022-05-24 ENCOUNTER — Ambulatory Visit: Payer: Medicare HMO | Admitting: Advanced Practice Midwife

## 2022-05-24 VITALS — BP 126/84 | Ht 63.0 in

## 2022-05-24 DIAGNOSIS — R6882 Decreased libido: Secondary | ICD-10-CM | POA: Diagnosis not present

## 2022-05-24 DIAGNOSIS — R232 Flushing: Secondary | ICD-10-CM | POA: Diagnosis not present

## 2022-05-25 ENCOUNTER — Encounter: Payer: Self-pay | Admitting: Advanced Practice Midwife

## 2022-05-25 LAB — PROGESTERONE: Progesterone: 0.1 ng/mL

## 2022-05-25 LAB — TESTOSTERONE: Testosterone: 15 ng/dL (ref 4–50)

## 2022-05-25 LAB — LUTEINIZING HORMONE: LH: 16.6 m[IU]/mL

## 2022-05-25 LAB — ESTRADIOL: Estradiol: 10.6 pg/mL

## 2022-05-25 NOTE — Progress Notes (Signed)
Patient ID: Leslie Zamora, female   DOB: 10-May-1971, 51 y.o.   MRN: [id number]  Reason for Consult: Follow-up (Pt wants to speak with someone about her labs and the results, did not want to see Dr Leslie Zamora. Just needs to know what her labs mean Leslie Zamora) )   Subjective:  Date of Service: 05/24/2022  HPI:  Leslie Zamora is a 51 y.o. female being seen to discuss her lab results. There was some confusion if she needed to schedule an appointment for this versus a MyChart message or telephone call. The patient expresses her frustration about this as well as about her visit with Dr Leslie Zamora. She has questions about her recent labs/what they mean. We discussed the results and I offered to check her other hormones to determine where she might be in menopause process. She wonders if she should continue to take progesterone to control bleeding. Her Leslie Zamora lab likely indicates nearing post menopause. I told her I will send her a MyChart message regarding today's lab results. I suggested that she may want to have a trial off progesterone to see if she has any further bleeding. She is concerned about her decreased libido. I also suggested that she talk with office manager Denyse Dago regarding her concerns with the previous visit.   Past Medical History:  Diagnosis Date   Diabetes mellitus without complication (HCC)    Hypertension    Mitral valve prolapse    Obesity (BMI 30-39.9)    Family History  Adopted: Yes  Problem Relation Age of Onset   Ovarian cancer Sister 29   Past Surgical History:  Procedure Laterality Date   COLONOSCOPY WITH PROPOFOL N/A 10/17/2017   Procedure: COLONOSCOPY WITH PROPOFOL;  Surgeon: [name], MD;  Location: [facility] ENDOSCOPY;  Service: Endoscopy;  Laterality: N/A;   LUMBAR FUSION  2005,2008, 2014, 2017   [place]   NASAL SEPTUM SURGERY     TONSILLECTOMY     TUBAL LIGATION  2001    Short Social History:  Social History   Tobacco Use   Smoking status:  Every Day    Packs/day: 1.00    Types: Cigarettes   Smokeless tobacco: Never  Substance Use Topics   Alcohol use: Yes    Comment: occ    Allergies  Allergen Reactions   Cephalexin Other (See Comments), Cough, Itching, Rash and Shortness Of Breath   Other Shortness Of Breath and Swelling    "filler in the gabapentin" currently taking gabapentin from a different drug manufactory "Low reaction"    Clindamycin/Lincomycin Itching   Gabapentin    Prednisone Other (See Comments)    rage   Topiramate     Current Outpatient Medications  Medication Sig Dispense Refill   ACCU-CHEK GUIDE test strip USE TO MONITOR BLOOD GLUCOSE 2 TIME(S) DAILY     Accu-Chek Softclix Lancets lancets 2 (two) times daily.     albuterol (VENTOLIN HFA) 108 (90 Base) MCG/ACT inhaler Inhale 2 puffs into the lungs every 4 (four) hours as needed for wheezing or shortness of breath. 8 g 0   amitriptyline (ELAVIL) 50 MG tablet Take 50 mg by mouth at bedtime.     aspirin-acetaminophen-caffeine (EXCEDRIN MIGRAINE) 250-250-65 MG tablet Take 2 tablets by mouth every 6 (six) hours as needed.     atorvastatin (LIPITOR) 80 MG tablet Take 80 mg by mouth daily.     baclofen (LIORESAL) 20 MG tablet Take 20 mg by mouth 3 (three) times daily as needed.  BIOTIN PO Take 1 capsule by mouth daily.     Blood Glucose Monitoring Suppl (ACCU-CHEK GUIDE ME) w/Device KIT by Does not apply route.     cetirizine (ZYRTEC) 10 MG tablet Take 10 mg by mouth daily.     diphenhydrAMINE (BENADRYL) 12.5 MG chewable tablet      DULoxetine (CYMBALTA) 60 MG capsule Take 60 mg by mouth daily.     famotidine (PEPCID) 40 MG tablet SMARTSIG:1 Tablet(s) By Mouth Every Evening     fluticasone (FLONASE) 50 MCG/ACT nasal spray USE 1 SQUIRT IN EACH NOSTRIL DAILY  0   gabapentin (NEURONTIN) 600 MG tablet Take 600 mg by mouth 4 (four) times daily as needed.     HYDROcodone-acetaminophen (NORCO) 10-325 MG tablet SMARTSIG:1 Tablet(s) By Mouth Every 12 Hours PRN      lisinopril-hydrochlorothiazide (ZESTORETIC) 20-25 MG tablet Take 1 tablet by mouth daily.     LORazepam (ATIVAN) 0.5 MG tablet Take 0.5 mg by mouth every 8 (eight) hours.     MECLIZINE HCL PO Take 25 mg by mouth as needed.     medroxyPROGESTERone (PROVERA) 10 MG tablet Take 2 tablets (20 mg total) by mouth daily. 180 tablet 1   metFORMIN (GLUCOPHAGE-XR) 500 MG 24 hr tablet Take 500 mg by mouth in the morning and at bedtime. With meals     metoprolol succinate (TOPROL-XL) 100 MG 24 hr tablet Take 100 mg by mouth daily.     Multiple Vitamin (MULTIVITAMIN WITH MINERALS) TABS tablet Take 1 tablet by mouth daily.     naproxen (NAPROSYN) 500 MG tablet Take 1 tablet (500 mg total) by mouth 2 (two) times daily with a meal. 20 tablet 2   oxyCODONE-acetaminophen (PERCOCET) 10-325 MG tablet Take 1 tablet by mouth 2 (two) times daily as needed.     OZEMPIC, 0.25 OR 0.5 MG/DOSE, 2 MG/1.5ML SOPN Inject 0.5 mg into the skin once a week.     pantoprazole (PROTONIX) 40 MG tablet Take 1 tablet by mouth daily.     rizatriptan (MAXALT-MLT) 10 MG disintegrating tablet Take 10 mg by mouth as needed. May repeat in 2 hrs if needed. Max dose 2 in 24 hrs     No current facility-administered medications for this visit.    Review of Systems  Constitutional:  Negative for chills and fever.  HENT:  Negative for congestion, ear discharge, ear pain, hearing loss, sinus pain and sore throat.   Eyes:  Negative for blurred vision and double vision.  Respiratory:  Negative for cough, shortness of breath and wheezing.   Cardiovascular:  Negative for chest pain, palpitations and leg swelling.  Gastrointestinal:  Negative for abdominal pain, blood in stool, constipation, diarrhea, heartburn, melena, nausea and vomiting.  Genitourinary:  Negative for dysuria, flank pain, frequency, hematuria and urgency.  Musculoskeletal:  Negative for back pain, joint pain and myalgias.  Skin:  Negative for itching and rash.  Neurological:   Negative for dizziness, tingling, tremors, sensory change, speech change, focal weakness, seizures, loss of consciousness, weakness and headaches.  Endo/Heme/Allergies:  Negative for environmental allergies. Does not bruise/bleed easily.       Positive for decreased libido  Psychiatric/Behavioral:  Negative for depression, hallucinations, memory loss, substance abuse and suicidal ideas. The patient is not nervous/anxious and does not have insomnia.         Objective:  Objective   Vitals:   05/24/22 1602  BP: 126/84  Height: _0  (1.6 m)   Body mass index is 39.33 kg/m. Constitutional:  Well nourished, well developed female in no acute distress.  HEENT: normal Skin: Warm and dry.  Extremity:  no edema   Respiratory:  Normal respiratory effort Neuro: DTRs 2+, Cranial nerves grossly intact Psych: Alert and Oriented x3. No memory deficits. Normal mood and affect.   Data:  Latest Reference Range & Units 05/14/22 09:45 05/24/22 16:41  LH mIU/mL  16.6  FSH mIU/mL 23.0   Estradiol pg/mL  10.6  Progesterone ng/mL  0.1  Testosterone 4 - 50 ng/dL  15  TSH 0.450 - 4.500 uIU/mL 1.640   T4,Free(Direct) 0.82 - 1.77 ng/dL 1.32    Follow up to visit: MyChart message sent to patient regarding lab results  Time spent caring for this patient/greater than 50% in consultation: 15 minutes Assessment/Plan:     51 y.o. G5 P2 female lab result consultation  Labs ordered: LH, Progesterone, Estradiol, Testosterone Follow up as needed   [facility] 05/25/2022, 10:51 PM

## 2022-06-04 ENCOUNTER — Other Ambulatory Visit: Payer: Self-pay | Admitting: Advanced Practice Midwife

## 2022-06-04 ENCOUNTER — Telehealth: Payer: Self-pay | Admitting: Advanced Practice Midwife

## 2022-06-04 DIAGNOSIS — R6882 Decreased libido: Secondary | ICD-10-CM

## 2022-06-04 NOTE — Progress Notes (Signed)
Referral for sex therapist placed per patient request.

## 2022-06-04 NOTE — Telephone Encounter (Signed)
Received a call from the patient who was not happy with her recent experiences with our office. Patient had concerns after seeing Dr. Hulan Fray, felt Dr. Hulan Fray had no regard for her concerns,and told staff repeatedly that she did not want to see Dr. Hulan Fray and only wanted more information on her lab results. Patient saw Opal Sidles, and it turned out that the full panel was not ordered, so Opal Sidles ordered more labwork which the patient had done. Patient is also concerned about two copays. Patient understands insurance companies set the copays and pay according to the plan, but to please let me know if she has any concerns if/when she receives her bill, that I would be happy to send for review for accuracy, although I could not change anything on this end. Patient understands. Patient commented that our "receptionists up front are phenomenal". I thanked the patient for letting me know about her experience, apologized, we talked of our upcoming combined practice and the providers. Patient would like Opal Sidles to place a referral (therapist, sexual issues) and I will speak to Opal Sidles as she is in clinic today. I again apologized for the patient's experience, thanked her for talking with me, and let her know to contact me if she needs anything else.

## 2022-07-05 ENCOUNTER — Other Ambulatory Visit: Payer: Self-pay

## 2022-07-05 MED ORDER — MEDROXYPROGESTERONE ACETATE 10 MG PO TABS: 20.0000 mg | ORAL_TABLET | Freq: Every day | ORAL | 1 refills | Status: AC

## 2022-10-07 ENCOUNTER — Telehealth: Payer: Self-pay

## 2022-10-07 ENCOUNTER — Other Ambulatory Visit: Payer: Self-pay

## 2022-10-07 DIAGNOSIS — Z86010 Personal history of colonic polyps: Secondary | ICD-10-CM

## 2022-10-07 MED ORDER — NA SULFATE-K SULFATE-MG SULF 17.5-3.13-1.6 GM/177ML PO SOLN: 1.0000 | Freq: Once | ORAL | 0 refills | Status: AC

## 2022-10-07 NOTE — Telephone Encounter (Signed)
Gastroenterology Pre-Procedure Review  Request Date: 10/21/22 Requesting Physician: Dr.Anna  PATIENT REVIEW QUESTIONS: The patient responded to the following health history questions as indicated:    1. Are you having any GI issues? yes (increased diarrhea) 2. Do you have a personal history of Polyps? yes (10/17/17 colonoscopy performed by Dr. Vicente Males) 3. Do you have a family history of Colon Cancer or Polyps? no 4. Diabetes Mellitus? yes (Patient advised to stop Ozempic 7 days prior to colonoscopy.  Stop metformin 2 days prior to colonoscopy) 5. Joint replacements in the past 12 months?no 6. Major health problems in the past 3 months?no 7. Any artificial heart valves, MVP, or defibrillator?no    MEDICATIONS & ALLERGIES:    Patient reports the following regarding taking any anticoagulation/antiplatelet therapy:   Plavix, Coumadin, Eliquis, Xarelto, Lovenox, Pradaxa, Brilinta, or Effient? no Aspirin? 81 mg daily  Patient confirms/reports the following medications:  Current Outpatient Medications  Medication Sig Dispense Refill   ACCU-CHEK GUIDE test strip USE TO MONITOR BLOOD GLUCOSE 2 TIME(S) DAILY     Accu-Chek Softclix Lancets lancets 2 (two) times daily.     albuterol (VENTOLIN HFA) 108 (90 Base) MCG/ACT inhaler Inhale 2 puffs into the lungs every 4 (four) hours as needed for wheezing or shortness of breath. 8 g 0   amitriptyline (ELAVIL) 50 MG tablet Take 50 mg by mouth at bedtime.     aspirin-acetaminophen-caffeine (EXCEDRIN MIGRAINE) 250-250-65 MG tablet Take 2 tablets by mouth every 6 (six) hours as needed.     atorvastatin (LIPITOR) 80 MG tablet Take 80 mg by mouth daily.     baclofen (LIORESAL) 20 MG tablet Take 20 mg by mouth 3 (three) times daily as needed.     BIOTIN PO Take 1 capsule by mouth daily.     Blood Glucose Monitoring Suppl (ACCU-CHEK GUIDE ME) w/Device KIT by Does not apply route.     cetirizine (ZYRTEC) 10 MG tablet Take 10 mg by mouth daily.     diphenhydrAMINE  (BENADRYL) 12.5 MG chewable tablet      DULoxetine (CYMBALTA) 60 MG capsule Take 60 mg by mouth daily.     famotidine (PEPCID) 40 MG tablet SMARTSIG:1 Tablet(s) By Mouth Every Evening     fluticasone (FLONASE) 50 MCG/ACT nasal spray USE 1 SQUIRT IN EACH NOSTRIL DAILY  0   gabapentin (NEURONTIN) 600 MG tablet Take 600 mg by mouth 4 (four) times daily as needed.     HYDROcodone-acetaminophen (NORCO) 10-325 MG tablet SMARTSIG:1 Tablet(s) By Mouth Every 12 Hours PRN     lisinopril-hydrochlorothiazide (ZESTORETIC) 20-25 MG tablet Take 1 tablet by mouth daily.     LORazepam (ATIVAN) 0.5 MG tablet Take 0.5 mg by mouth every 8 (eight) hours.     MECLIZINE HCL PO Take 25 mg by mouth as needed.     medroxyPROGESTERone (PROVERA) 10 MG tablet Take 2 tablets (20 mg total) by mouth daily. 180 tablet 1   metFORMIN (GLUCOPHAGE-XR) 500 MG 24 hr tablet Take 500 mg by mouth in the morning and at bedtime. With meals     metoprolol succinate (TOPROL-XL) 100 MG 24 hr tablet Take 100 mg by mouth daily.     Multiple Vitamin (MULTIVITAMIN WITH MINERALS) TABS tablet Take 1 tablet by mouth daily.     naproxen (NAPROSYN) 500 MG tablet Take 1 tablet (500 mg total) by mouth 2 (two) times daily with a meal. 20 tablet 2   oxyCODONE-acetaminophen (PERCOCET) 10-325 MG tablet Take 1 tablet by mouth 2 (two) times  daily as needed.     OZEMPIC, 0.25 OR 0.5 MG/DOSE, 2 MG/1.5ML SOPN Inject 0.5 mg into the skin once a week.     pantoprazole (PROTONIX) 40 MG tablet Take 1 tablet by mouth daily.     rizatriptan (MAXALT-MLT) 10 MG disintegrating tablet Take 10 mg by mouth as needed. May repeat in 2 hrs if needed. Max dose 2 in 24 hrs     No current facility-administered medications for this visit.    Patient confirms/reports the following allergies:  Allergies  Allergen Reactions   Cephalexin Other (See Comments), Cough, Itching, Rash and Shortness Of Breath   Other Shortness Of Breath and Swelling    "filler in the gabapentin"  currently taking gabapentin from a different drug manufactory "Low reaction"    Clindamycin/Lincomycin Itching   Gabapentin    Prednisone Other (See Comments)    rage   Topiramate     No orders of the defined types were placed in this encounter.   AUTHORIZATION INFORMATION Primary Insurance: 1D#: Group #:  Secondary Insurance: 1D#: Group #:  SCHEDULE INFORMATION: Date: 10/21/22  Time: Location: [facility]

## 2022-10-17 ENCOUNTER — Telehealth: Payer: Self-pay

## 2022-10-17 MED ORDER — GOLYTELY 236 G PO SOLR: 4000.0000 mL | Freq: Once | ORAL | 0 refills | Status: AC

## 2022-10-17 NOTE — Telephone Encounter (Signed)
Pt contacted office stated Suprep not covered.  Requested Golytely prep to be sent to pharmacy.  Golytely rx sent to [facility] on [address].  Contacted patient provided her with the following instructions:  At 5pm the evening before your procedure fill Golytely container to the fill line with clear liquid.  Mix well.  Drink 8 oz every 15-30 mins until half has been completed.   5 hours prior to procedure time resume drinking the remainder half-drinking 8 oz every 15-30 mins until entire contents have been completed.  Be sure to finish 2 hours prior to your procedure.  Do not eat or drink anything 2 hours prior to procedure.  Pt verbalized understanding.  These instructions will be sent to her in [place] message as well.  Thanks,  [place], [place]

## 2022-10-21 ENCOUNTER — Ambulatory Visit
Admission: RE | Admit: 2022-10-21 | Discharge: 2022-10-21 | Disposition: A | Discharge status: Home / Self Care | Payer: Medicare HMO | Attending: Gastroenterology | Admitting: Gastroenterology

## 2022-10-21 ENCOUNTER — Encounter
Admission: RE | Disposition: A | Discharge status: Home / Self Care | Payer: Self-pay | Source: Home / Self Care | Attending: Gastroenterology

## 2022-10-21 ENCOUNTER — Ambulatory Visit: Payer: Medicare HMO | Admitting: Anesthesiology

## 2022-10-21 ENCOUNTER — Encounter: Payer: Self-pay | Admitting: Gastroenterology

## 2022-10-21 DIAGNOSIS — I341 Nonrheumatic mitral (valve) prolapse: Secondary | ICD-10-CM | POA: Diagnosis not present

## 2022-10-21 DIAGNOSIS — F17210 Nicotine dependence, cigarettes, uncomplicated: Secondary | ICD-10-CM | POA: Diagnosis not present

## 2022-10-21 DIAGNOSIS — I10 Essential (primary) hypertension: Secondary | ICD-10-CM | POA: Diagnosis not present

## 2022-10-21 DIAGNOSIS — E669 Obesity, unspecified: Secondary | ICD-10-CM | POA: Insufficient documentation

## 2022-10-21 DIAGNOSIS — D126 Benign neoplasm of colon, unspecified: Secondary | ICD-10-CM | POA: Diagnosis not present

## 2022-10-21 DIAGNOSIS — Z6830 Body mass index (BMI) 30.0-30.9, adult: Secondary | ICD-10-CM | POA: Diagnosis not present

## 2022-10-21 DIAGNOSIS — Z1211 Encounter for screening for malignant neoplasm of colon: Secondary | ICD-10-CM | POA: Diagnosis present

## 2022-10-21 DIAGNOSIS — Z86010 Personal history of colonic polyps: Secondary | ICD-10-CM | POA: Diagnosis not present

## 2022-10-21 DIAGNOSIS — E119 Type 2 diabetes mellitus without complications: Secondary | ICD-10-CM | POA: Diagnosis not present

## 2022-10-21 DIAGNOSIS — D122 Benign neoplasm of ascending colon: Secondary | ICD-10-CM | POA: Diagnosis not present

## 2022-10-21 HISTORY — DX: Supraventricular tachycardia, unspecified: I47.10

## 2022-10-21 HISTORY — PX: COLONOSCOPY WITH PROPOFOL: SHX5780

## 2022-10-21 LAB — GLUCOSE, CAPILLARY: Glucose-Capillary: 107 mg/dL — ABNORMAL HIGH (ref 70–99)

## 2022-10-21 SURGERY — COLONOSCOPY WITH PROPOFOL
Anesthesia: General

## 2022-10-21 MED ORDER — LIDOCAINE 2% (20 MG/ML) 5 ML SYRINGE
INTRAMUSCULAR | Status: DC | PRN
  Administered 2022-10-21: 50 mg via INTRAVENOUS

## 2022-10-21 MED ORDER — PROPOFOL 10 MG/ML IV BOLUS
INTRAVENOUS | Status: DC | PRN
  Administered 2022-10-21: 20 mg via INTRAVENOUS
  Administered 2022-10-21: 80 mg via INTRAVENOUS

## 2022-10-21 MED ORDER — OXYMETAZOLINE HCL 0.05 % NA SOLN
NASAL | Status: AC
  Filled 2022-10-21: qty 30

## 2022-10-21 MED ORDER — SODIUM CHLORIDE 0.9 % IV SOLN: INTRAVENOUS | Status: DC

## 2022-10-21 MED ORDER — PROPOFOL 500 MG/50ML IV EMUL
INTRAVENOUS | Status: DC | PRN
  Administered 2022-10-21: 150 ug/kg/min via INTRAVENOUS

## 2022-10-21 MED ORDER — LIDOCAINE HCL (PF) 2 % IJ SOLN
INTRAMUSCULAR | Status: AC
  Filled 2022-10-21: qty 5

## 2022-10-21 NOTE — Transfer of Care (Signed)
Immediate Anesthesia Transfer of Care Note  Patient: Leslie Zamora  Procedure(s) Performed: COLONOSCOPY WITH PROPOFOL  Patient Location: Endoscopy Unit  Anesthesia Type:General  Level of Consciousness: awake, alert , and oriented  Airway & Oxygen Therapy: Patient Spontanous Breathing  Post-op Assessment: Report given to RN and Post -op Vital signs reviewed and stable  Post vital signs: Reviewed and stable  Last Vitals:  Vitals Value Taken Time  BP 104/59 10/21/22 1050  Temp 36.4 C 10/21/22 1050  Pulse 88 10/21/22 1052  Resp 19 10/21/22 1052  SpO2 100 % 10/21/22 1052  Vitals shown include unvalidated device data.  Last Pain:  Vitals:   10/21/22 1050  TempSrc: Temporal  PainSc: 0-No pain         Complications: No notable events documented.

## 2022-10-21 NOTE — Anesthesia Preprocedure Evaluation (Signed)
Anesthesia Evaluation  Patient identified by MRN, date of birth, ID band Patient awake    Reviewed: Allergy & Precautions, NPO status , Patient's Chart, lab work & pertinent test results  History of Anesthesia Complications Negative for: history of anesthetic complications  Airway Mallampati: III  TM Distance: >3 FB Neck ROM: Full    Dental no notable dental hx. (+) Dental Advidsory Given   Pulmonary neg shortness of breath, neg sleep apnea, neg COPD, neg recent URI, Current Smoker and Patient abstained from smoking.   breath sounds clear to auscultation- rhonchi (-) wheezing      Cardiovascular Exercise Tolerance: Good hypertension, Pt. on medications (-) angina (-) CAD, (-) Past MI, (-) Cardiac Stents and (-) CABG + dysrhythmias Supra Ventricular Tachycardia  Rhythm:Regular Rate:Normal - Systolic murmurs and - Diastolic murmurs    Neuro/Psych  PSYCHIATRIC DISORDERS Anxiety Depression    negative neurological ROS     GI/Hepatic negative GI ROS, Neg liver ROS,,,  Endo/Other  negative endocrine ROSdiabetes    Renal/GU negative Renal ROS     Musculoskeletal negative musculoskeletal ROS (+)    Abdominal  (+) + obese  Peds  Hematology negative hematology ROS (+)   Anesthesia Other Findings Past Medical History: No date: Hypertension No date: Mitral valve prolapse   Reproductive/Obstetrics negative OB ROS                             Anesthesia Physical Anesthesia Plan  ASA: 3  Anesthesia Plan: General   Post-op Pain Management:    Induction: Intravenous  PONV Risk Score and Plan: 2 and Propofol infusion and TIVA  Airway Management Planned: Natural Airway and Nasal Cannula  Additional Equipment:   Intra-op Plan:   Post-operative Plan:   Informed Consent: I have reviewed the patients History and Physical, chart, labs and discussed the procedure including the risks, benefits and  alternatives for the proposed anesthesia with the patient or authorized representative who has indicated his/her understanding and acceptance.     Dental advisory given  Plan Discussed with: CRNA and Anesthesiologist  Anesthesia Plan Comments:         Anesthesia Quick Evaluation

## 2022-10-21 NOTE — H&P (Signed)
Leslie Bellows, MD [address], [place], [place], [place], [address] [address], [place], [place], [place], [address] Phone: [phone]  Fax: [phone]  Primary Care Physician:  Nicholes Rough, PA-C   Pre-Procedure History & Physical: HPI:  Leslie Zamora is a 51 y.o. female is here for an colonoscopy.   Past Medical History:  Diagnosis Date   Diabetes mellitus without complication (HCC)    Hypertension    Mitral valve prolapse    Obesity (BMI 30-39.9)    SVT (supraventricular tachycardia)     Past Surgical History:  Procedure Laterality Date   COLONOSCOPY WITH PROPOFOL N/A 10/17/2017   Procedure: COLONOSCOPY WITH PROPOFOL;  Surgeon: Leslie Bellows, MD;  Location: [facility] ENDOSCOPY;  Service: Endoscopy;  Laterality: N/A;   LUMBAR FUSION  2005,2008, 2014, 2017   [place]   NASAL SEPTUM SURGERY     TONSILLECTOMY     TUBAL LIGATION  2001    Prior to Admission medications   Medication Sig Start Date End Date Taking? Authorizing Provider  albuterol (VENTOLIN HFA) 108 (90 Base) MCG/ACT inhaler Inhale 2 puffs into the lungs every 4 (four) hours as needed for wheezing or shortness of breath. 08/18/19  Yes Cuthriell, Charline Bills, PA-C  amitriptyline (ELAVIL) 50 MG tablet Take 50 mg by mouth at bedtime. 03/10/20  Yes [provider]  atorvastatin (LIPITOR) 80 MG tablet Take 80 mg by mouth daily. 08/21/21  Yes [provider]  baclofen (LIORESAL) 20 MG tablet Take 20 mg by mouth 3 (three) times daily as needed. 09/11/20  Yes [provider]  DULoxetine (CYMBALTA) 60 MG capsule Take 60 mg by mouth daily. 01/31/21  Yes [provider]  gabapentin (NEURONTIN) 600 MG tablet Take 600 mg by mouth 4 (four) times daily as needed. 10/02/21  Yes [provider]  HYDROcodone-acetaminophen (NORCO) 10-325 MG tablet SMARTSIG:1 Tablet(s) By Mouth Every 12 Hours PRN 04/16/22  Yes [provider]  lisinopril-hydrochlorothiazide  (ZESTORETIC) 20-25 MG tablet Take 1 tablet by mouth daily. 03/23/20  Yes [provider]  LORazepam (ATIVAN) 0.5 MG tablet Take 0.5 mg by mouth every 8 (eight) hours.   Yes [provider]  pantoprazole (PROTONIX) 40 MG tablet Take 1 tablet by mouth daily. 10/13/20  Yes [provider]  rizatriptan (MAXALT-MLT) 10 MG disintegrating tablet Take 10 mg by mouth as needed. May repeat in 2 hrs if needed. Max dose 2 in 24 hrs 07/22/19  Yes [provider]  ACCU-CHEK GUIDE test strip USE TO MONITOR BLOOD GLUCOSE 2 TIME(S) DAILY 04/17/22   [provider]  Accu-Chek Softclix Lancets lancets 2 (two) times daily. 12/25/21   [provider]  aspirin-acetaminophen-caffeine (EXCEDRIN MIGRAINE) 2793778722 MG tablet Take 2 tablets by mouth every 6 (six) hours as needed.    [provider]  BIOTIN PO Take 1 capsule by mouth daily.    [provider]  Blood Glucose Monitoring Suppl (ACCU-CHEK GUIDE ME) w/Device KIT by Does not apply route. 04/11/21   [provider]  cetirizine (ZYRTEC) 10 MG tablet Take 10 mg by mouth daily.    [provider]  diphenhydrAMINE (BENADRYL) 12.5 MG chewable tablet  02/17/19   [provider]  famotidine (PEPCID) 40 MG tablet SMARTSIG:1 Tablet(s) By Mouth Every Evening 04/28/22   [provider]  fluticasone (FLONASE) 50 MCG/ACT nasal spray USE 1 SQUIRT IN EACH NOSTRIL DAILY 05/23/17   [provider]  MECLIZINE HCL PO Take 25 mg by mouth as  needed.    [provider]  medroxyPROGESTERone (PROVERA) 10 MG tablet Take 2 tablets (20 mg total) by mouth daily. 07/05/22   Rod Can, CNM  metFORMIN (GLUCOPHAGE-XR) 500 MG 24 hr tablet Take 500 mg by mouth in the morning and at bedtime. With meals 01/02/21   [provider]  metoprolol succinate (TOPROL-XL) 100 MG 24 hr tablet Take 100 mg by mouth daily. 04/05/22   [provider]  Multiple Vitamin (MULTIVITAMIN  WITH MINERALS) TABS tablet Take 1 tablet by mouth daily.    [provider]  naproxen (NAPROSYN) 500 MG tablet Take 1 tablet (500 mg total) by mouth 2 (two) times daily with a meal. Patient not taking: Reported on 10/21/2022 03/01/22   Lavonia Drafts, MD  oxyCODONE-acetaminophen (PERCOCET) 10-325 MG tablet Take 1 tablet by mouth 2 (two) times daily as needed. Patient not taking: Reported on 10/21/2022 03/04/22   [provider]  OZEMPIC, 0.25 OR 0.5 MG/DOSE, 2 MG/1.5ML SOPN Inject 0.5 mg into the skin once a week. 01/15/22   [provider]    Allergies as of 10/07/2022 - Review Complete 05/25/2022  Allergen Reaction Noted   Cephalexin Other (See Comments), Cough, Itching, Rash, and Shortness Of Breath 10/21/2016   Other Shortness Of Breath and Swelling 02/24/2015   Clindamycin/lincomycin Itching 11/29/2015   Gabapentin  02/24/2015   Prednisone Other (See Comments) 12/22/2018   Topiramate  03/26/2019    Family History  Adopted: Yes  Problem Relation Age of Onset   Ovarian cancer Sister 51    Social History   Socioeconomic History   Marital status: Married    Spouse name: Not on file   Number of children: Not on file   Years of education: Not on file   Highest education level: Not on file  Occupational History   Not on file  Tobacco Use   Smoking status: Every Day    Packs/day: 1.00    Types: Cigarettes   Smokeless tobacco: Never  Vaping Use   Vaping Use: Never used  Substance and Sexual Activity   Alcohol use: Yes    Comment: occ   Drug use: No   Sexual activity: Not Currently    Birth control/protection: Surgical  Other Topics Concern   Not on file  Social History Narrative   Not on file   Social Determinants of Health   Financial Resource Strain: Not on file  Food Insecurity: Not on file  Transportation Needs: Not on file  Physical Activity: Not on file  Stress: Not on file  Social Connections: Not on file  Intimate Partner  Violence: Not on file    Review of Systems: See HPI, otherwise negative ROS  Physical Exam: There were no vitals taken for this visit. General:   Alert,  pleasant and cooperative in NAD Head:  Normocephalic and atraumatic. Neck:  Supple; no masses or thyromegaly. Lungs:  Clear throughout to auscultation, normal respiratory effort.    Heart:  +S1, +S2, Regular rate and rhythm, No edema. Abdomen:  Soft, nontender and nondistended. Normal bowel sounds, without guarding, and without rebound.   Neurologic:  Alert and  oriented x4;  grossly normal neurologically.  Impression/Plan: Leslie Zamora is here for an colonoscopy to be performed for surveillance due to prior history of colon polyps   Risks, benefits, limitations, and alternatives regarding  colonoscopy have been reviewed with the patient.  Questions have been answered.  All parties agreeable.   Leslie Bellows, MD  10/21/2022, 10:03 AM

## 2022-10-21 NOTE — Anesthesia Postprocedure Evaluation (Signed)
Anesthesia Post Note  Patient: Leslie Zamora  Procedure(s) Performed: COLONOSCOPY WITH PROPOFOL  Patient location during evaluation: Endoscopy Anesthesia Type: General Level of consciousness: awake and alert Pain management: pain level controlled Vital Signs Assessment: post-procedure vital signs reviewed and stable Respiratory status: spontaneous breathing, nonlabored ventilation, respiratory function stable and patient connected to nasal cannula oxygen Cardiovascular status: blood pressure returned to baseline and stable Postop Assessment: no apparent nausea or vomiting Anesthetic complications: no   No notable events documented.   Last Vitals:  Vitals:   10/21/22 1100 10/21/22 1110  BP: (!) 103/47 (!) 104/59  Pulse: 85 78  Resp: 13 14  Temp:    SpO2: 100% 99%    Last Pain:  Vitals:   10/21/22 1110  TempSrc:   PainSc: 0-No pain                 Martha Clan

## 2022-10-21 NOTE — Op Note (Signed)
[facility] Patient Name: Leslie Zamora Procedure Date: 10/21/2022 10:26 AM MRN: [id number] Account #: [id number] Date of Birth: February 14, 1971 Admit Type: Outpatient Age: 51 Room: [facility] ENDO ROOM 1 Gender: Female Note Status: Finalized Instrument Name: Jasper Riling [id number] Procedure:             Colonoscopy Indications:           Surveillance: Personal history of adenomatous polyps                         on last colonoscopy 5 years ago Providers:             [name] MD, MD Referring MD:          Nicholes Rough (Referring MD) Medicines:             Monitored Anesthesia Care Complications:         No immediate complications. Procedure:             Pre-Anesthesia Assessment:                        - Prior to the procedure, a History and Physical was                         performed, and patient medications, allergies and                         sensitivities were reviewed. The patient's tolerance                         of previous anesthesia was reviewed.                        - The risks and benefits of the procedure and the                         sedation options and risks were discussed with the                         patient. All questions were answered and informed                         consent was obtained.                        - ASA Grade Assessment: II - A patient with mild                         systemic disease.                        After obtaining informed consent, the colonoscope was                         passed under direct vision. Throughout the procedure,                         the patient's blood pressure, pulse, and oxygen                         saturations were  monitored continuously. The                         Colonoscope was introduced through the anus and                         advanced to the the cecum, identified by the                         appendiceal orifice. The colonoscopy was performed                          with ease. The patient tolerated the procedure well.                         The quality of the bowel preparation was excellent.                         The ileocecal valve, appendiceal orifice, and rectum                         were photographed. Findings:      The perianal and digital rectal examinations were normal.      A 5 mm polyp was found in the ascending colon. The polyp was sessile.       The polyp was removed with a cold snare. Resection and retrieval were       complete.      The exam was otherwise without abnormality on direct and retroflexion       views. Impression:            - One 5 mm polyp in the ascending colon, removed with                         a cold snare. Resected and retrieved.                        - The examination was otherwise normal on direct and                         retroflexion views. Recommendation:        - Discharge patient to home (with escort).                        - Resume previous diet.                        - Continue present medications.                        - Await pathology results.                        - Repeat colonoscopy for surveillance based on                         pathology results. Procedure Code(s):     --- Professional ---                        [phone], Colonoscopy, flexible; with removal of  tumor(s), polyp(s), or other lesion(s) by snare                         technique Diagnosis Code(s):     --- Professional ---                        Z86.010, Personal history of colonic polyps                        D12.2, Benign neoplasm of ascending colon CPT copyright 2022 [employer]. All rights reserved. The codes documented in this report are preliminary and upon coder review may  be revised to meet current compliance requirements. [name], MD [name] MD, MD 10/21/2022 10:50:37 AM This report has been signed electronically. Number of Addenda: 0 Note Initiated On:  10/21/2022 10:26 AM Scope Withdrawal Time: 0 hours 10 minutes 55 seconds  Total Procedure Duration: 0 hours 13 minutes 53 seconds  Estimated Blood Loss:  Estimated blood loss: none.      [facility]

## 2022-10-22 ENCOUNTER — Encounter: Payer: Self-pay | Admitting: Gastroenterology

## 2022-10-22 LAB — SURGICAL PATHOLOGY

## 2022-10-24 ENCOUNTER — Encounter: Payer: Self-pay | Admitting: Gastroenterology

## 2023-02-07 ENCOUNTER — Other Ambulatory Visit: Payer: Self-pay | Admitting: Orthopedic Surgery

## 2023-02-07 DIAGNOSIS — M5459 Other low back pain: Secondary | ICD-10-CM

## 2023-02-17 ENCOUNTER — Ambulatory Visit
Admission: RE | Admit: 2023-02-17 | Discharge: 2023-02-17 | Disposition: A | Discharge status: Home / Self Care | Payer: Medicare HMO | Source: Ambulatory Visit | Attending: Orthopedic Surgery | Admitting: Orthopedic Surgery

## 2023-02-17 DIAGNOSIS — M5459 Other low back pain: Secondary | ICD-10-CM

## 2023-02-17 MED ORDER — ONDANSETRON HCL 4 MG/2ML IJ SOLN: 4.0000 mg | Freq: Once | INTRAMUSCULAR | Status: DC | PRN

## 2023-02-17 MED ORDER — IOPAMIDOL (ISOVUE-M 200) INJECTION 41%
18.0000 mL | Freq: Once | INTRAMUSCULAR | Status: AC
  Administered 2023-02-17: 18 mL via INTRATHECAL

## 2023-02-17 MED ORDER — MEPERIDINE HCL 50 MG/ML IJ SOLN: 50.0000 mg | Freq: Once | INTRAMUSCULAR | Status: DC | PRN

## 2023-02-17 MED ORDER — DIAZEPAM 5 MG PO TABS: 10.0000 mg | ORAL_TABLET | Freq: Once | ORAL | Status: DC

## 2023-02-17 NOTE — Discharge Instructions (Signed)

## 2023-04-10 ENCOUNTER — Other Ambulatory Visit: Payer: Self-pay

## 2023-04-10 DIAGNOSIS — M5412 Radiculopathy, cervical region: Secondary | ICD-10-CM

## 2023-04-18 ENCOUNTER — Other Ambulatory Visit: Payer: Self-pay | Admitting: *Deleted

## 2023-04-18 DIAGNOSIS — M5412 Radiculopathy, cervical region: Secondary | ICD-10-CM

## 2023-05-14 ENCOUNTER — Other Ambulatory Visit: Payer: Medicare HMO

## 2023-10-09 ENCOUNTER — Other Ambulatory Visit: Payer: Self-pay | Admitting: Physician Assistant

## 2023-10-09 DIAGNOSIS — Z1231 Encounter for screening mammogram for malignant neoplasm of breast: Secondary | ICD-10-CM

## 2023-10-10 ENCOUNTER — Other Ambulatory Visit: Payer: Self-pay | Admitting: Orthopaedic Surgery

## 2023-10-10 DIAGNOSIS — M48061 Spinal stenosis, lumbar region without neurogenic claudication: Secondary | ICD-10-CM

## 2023-10-10 DIAGNOSIS — M4326 Fusion of spine, lumbar region: Secondary | ICD-10-CM

## 2023-10-14 ENCOUNTER — Encounter: Payer: Self-pay | Admitting: Orthopaedic Surgery

## 2023-10-15 NOTE — Discharge Instructions (Addendum)

## 2023-10-16 ENCOUNTER — Ambulatory Visit
Admission: RE | Admit: 2023-10-16 | Discharge: 2023-10-16 | Disposition: A | Discharge status: Home / Self Care | Payer: Medicare HMO | Source: Ambulatory Visit | Attending: Orthopaedic Surgery | Admitting: Orthopaedic Surgery

## 2023-10-16 DIAGNOSIS — M48061 Spinal stenosis, lumbar region without neurogenic claudication: Secondary | ICD-10-CM

## 2023-10-16 DIAGNOSIS — M4326 Fusion of spine, lumbar region: Secondary | ICD-10-CM

## 2023-10-16 MED ORDER — IOPAMIDOL (ISOVUE-M 200) INJECTION 41%
20.0000 mL | Freq: Once | INTRAMUSCULAR | Status: AC
  Administered 2023-10-16: 20 mL via INTRATHECAL

## 2023-10-16 MED ORDER — ONDANSETRON HCL 4 MG/2ML IJ SOLN: 4.0000 mg | Freq: Once | INTRAMUSCULAR | Status: DC | PRN

## 2023-10-16 MED ORDER — MEPERIDINE HCL 50 MG/ML IJ SOLN: 50.0000 mg | Freq: Once | INTRAMUSCULAR | Status: DC | PRN

## 2023-10-16 MED ORDER — DIAZEPAM 5 MG PO TABS
10.0000 mg | ORAL_TABLET | Freq: Once | ORAL | Status: AC
  Administered 2023-10-16: 10 mg via ORAL
